# Patient Record
Sex: Female | Born: 1975 | Race: Black or African American | Hispanic: No | Smoking: Current every day smoker
Health system: Southern US, Community
[De-identification: ages and names within clinical notes are randomized; demographics above are authoritative.]

## PROBLEM LIST (undated history)

## (undated) ENCOUNTER — Inpatient Hospital Stay (HOSPITAL_COMMUNITY): Payer: Self-pay

## (undated) DIAGNOSIS — R51 Headache: Secondary | ICD-10-CM

## (undated) DIAGNOSIS — R112 Nausea with vomiting, unspecified: Secondary | ICD-10-CM

## (undated) DIAGNOSIS — Z9889 Other specified postprocedural states: Secondary | ICD-10-CM

## (undated) DIAGNOSIS — M1990 Unspecified osteoarthritis, unspecified site: Secondary | ICD-10-CM

## (undated) DIAGNOSIS — E119 Type 2 diabetes mellitus without complications: Secondary | ICD-10-CM

## (undated) DIAGNOSIS — R569 Unspecified convulsions: Secondary | ICD-10-CM

## (undated) DIAGNOSIS — O209 Hemorrhage in early pregnancy, unspecified: Secondary | ICD-10-CM

## (undated) DIAGNOSIS — Z8759 Personal history of other complications of pregnancy, childbirth and the puerperium: Secondary | ICD-10-CM

## (undated) HISTORY — PX: HAND SURGERY: SHX662

## (undated) HISTORY — DX: Unspecified osteoarthritis, unspecified site: M19.90

## (undated) HISTORY — PX: WISDOM TOOTH EXTRACTION: SHX21

---

## 2008-08-18 ENCOUNTER — Emergency Department (HOSPITAL_COMMUNITY): Admission: EM | Admit: 2008-08-18 | Discharge: 2008-08-18 | Payer: Self-pay | Admitting: *Deleted

## 2008-09-11 ENCOUNTER — Emergency Department (HOSPITAL_COMMUNITY): Admission: EM | Admit: 2008-09-11 | Discharge: 2008-09-11 | Payer: Self-pay | Admitting: Emergency Medicine

## 2009-02-02 ENCOUNTER — Emergency Department: Payer: Self-pay | Admitting: Emergency Medicine

## 2009-02-23 ENCOUNTER — Emergency Department (HOSPITAL_COMMUNITY): Admission: EM | Admit: 2009-02-23 | Discharge: 2009-02-23 | Payer: Self-pay | Admitting: Emergency Medicine

## 2009-03-01 ENCOUNTER — Emergency Department (HOSPITAL_COMMUNITY): Admission: EM | Admit: 2009-03-01 | Discharge: 2009-03-01 | Payer: Self-pay | Admitting: Emergency Medicine

## 2009-07-13 ENCOUNTER — Inpatient Hospital Stay (HOSPITAL_COMMUNITY): Admission: AD | Admit: 2009-07-13 | Discharge: 2009-07-13 | Payer: Self-pay | Admitting: Obstetrics and Gynecology

## 2009-07-13 ENCOUNTER — Emergency Department (HOSPITAL_COMMUNITY): Admission: EM | Admit: 2009-07-13 | Discharge: 2009-07-13 | Payer: Self-pay | Admitting: Family Medicine

## 2009-09-02 ENCOUNTER — Ambulatory Visit: Payer: Self-pay | Admitting: Obstetrics and Gynecology

## 2009-09-02 ENCOUNTER — Inpatient Hospital Stay (HOSPITAL_COMMUNITY): Admission: AD | Admit: 2009-09-02 | Discharge: 2009-09-02 | Payer: Self-pay | Admitting: Obstetrics and Gynecology

## 2009-10-31 ENCOUNTER — Observation Stay (HOSPITAL_COMMUNITY): Admission: AD | Admit: 2009-10-31 | Discharge: 2009-11-01 | Payer: Self-pay | Admitting: Obstetrics and Gynecology

## 2010-01-06 ENCOUNTER — Ambulatory Visit (HOSPITAL_COMMUNITY): Admission: RE | Admit: 2010-01-06 | Discharge: 2010-01-06 | Payer: Self-pay | Admitting: Obstetrics and Gynecology

## 2010-01-31 ENCOUNTER — Ambulatory Visit (HOSPITAL_COMMUNITY): Admission: RE | Admit: 2010-01-31 | Discharge: 2010-01-31 | Payer: Self-pay | Admitting: Obstetrics and Gynecology

## 2010-02-17 ENCOUNTER — Inpatient Hospital Stay (HOSPITAL_COMMUNITY): Admission: AD | Admit: 2010-02-17 | Discharge: 2010-02-17 | Payer: Self-pay | Admitting: Obstetrics and Gynecology

## 2010-02-22 ENCOUNTER — Inpatient Hospital Stay (HOSPITAL_COMMUNITY): Admission: AD | Admit: 2010-02-22 | Discharge: 2010-02-25 | Payer: Self-pay | Admitting: Obstetrics and Gynecology

## 2010-08-13 ENCOUNTER — Emergency Department (HOSPITAL_COMMUNITY): Admission: EM | Admit: 2010-08-13 | Discharge: 2010-08-13 | Payer: Self-pay | Admitting: Emergency Medicine

## 2011-01-11 ENCOUNTER — Encounter: Payer: Self-pay | Admitting: Obstetrics and Gynecology

## 2011-02-11 ENCOUNTER — Ambulatory Visit (INDEPENDENT_AMBULATORY_CARE_PROVIDER_SITE_OTHER): Payer: Medicaid Other

## 2011-02-11 ENCOUNTER — Inpatient Hospital Stay (INDEPENDENT_AMBULATORY_CARE_PROVIDER_SITE_OTHER)
Admission: RE | Admit: 2011-02-11 | Discharge: 2011-02-11 | Disposition: A | Discharge status: Home / Self Care | Payer: Medicaid Other | Source: Ambulatory Visit | Attending: Emergency Medicine | Admitting: Emergency Medicine

## 2011-02-11 ENCOUNTER — Ambulatory Visit (HOSPITAL_COMMUNITY): Payer: Medicaid Other

## 2011-02-11 DIAGNOSIS — M79609 Pain in unspecified limb: Secondary | ICD-10-CM

## 2011-02-16 ENCOUNTER — Other Ambulatory Visit: Payer: Self-pay | Admitting: Orthopedic Surgery

## 2011-02-16 DIAGNOSIS — S92109A Unspecified fracture of unspecified talus, initial encounter for closed fracture: Secondary | ICD-10-CM

## 2011-02-18 ENCOUNTER — Ambulatory Visit
Admission: RE | Admit: 2011-02-18 | Discharge: 2011-02-18 | Disposition: A | Discharge status: Home / Self Care | Payer: Medicaid Other | Source: Ambulatory Visit | Attending: Orthopedic Surgery | Admitting: Orthopedic Surgery

## 2011-02-18 DIAGNOSIS — S92109A Unspecified fracture of unspecified talus, initial encounter for closed fracture: Secondary | ICD-10-CM

## 2011-03-08 IMAGING — CT CT ANKLE*R* W/O CM
4 series · 16 of 33 positions shown, 19 images · non-contrast
Comparison: Radiography same day

CLINICAL DATA: Talus fracture.  Fall from height.

CT OF THE RIGHT ANKLE WITHout CONTRAST
TECHNIQUE: Multidetector CT imaging was performed following the
standard protocol

[Series 3: lower ext bone · axial · 0.31mm/px · z∈[-84,+28]mm · 5 of 69 slices shown, 7 images]
[im 12/69  soft-tissue]
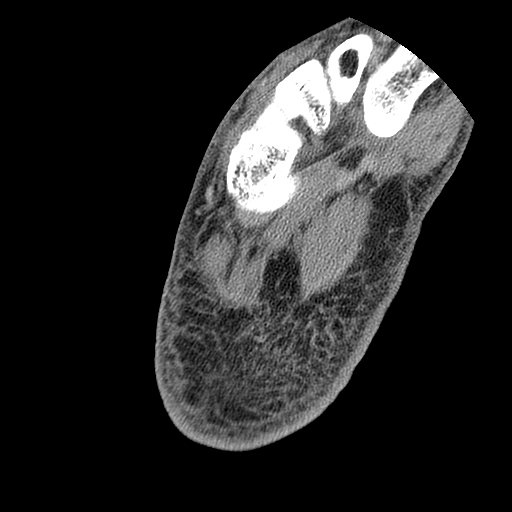
[im 12/69  bone]
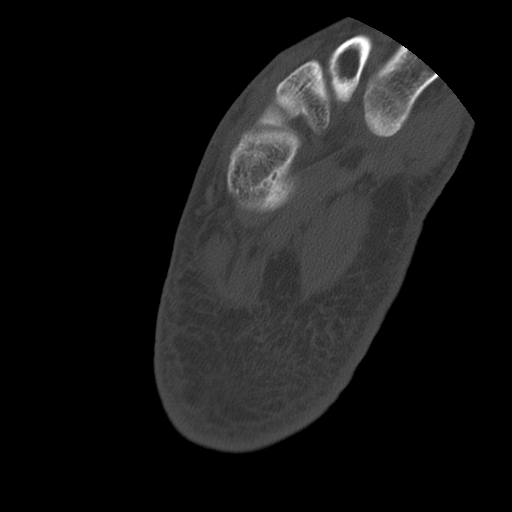
[im 23/69  bone]
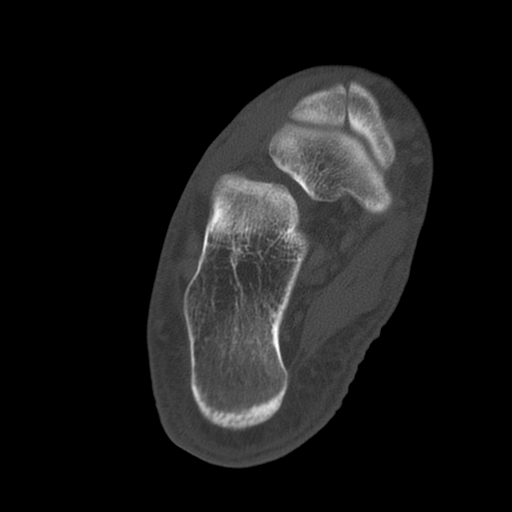
[im 35/69  bone]
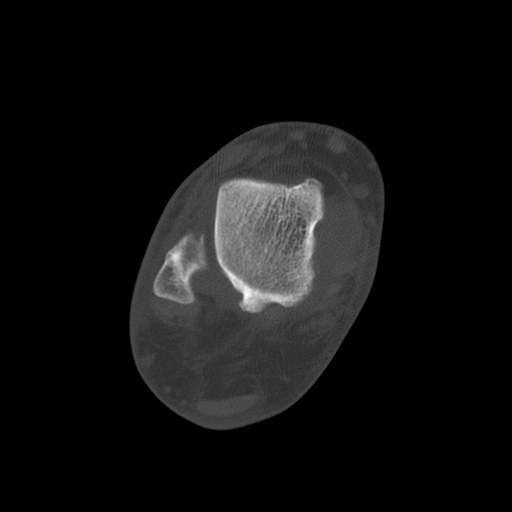
[im 46/69  bone]
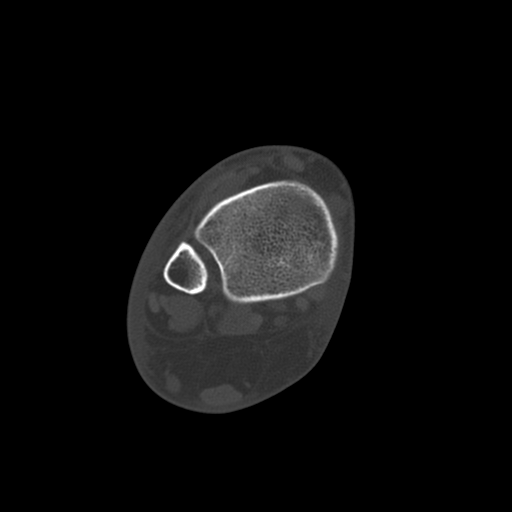
[im 57/69  soft-tissue]
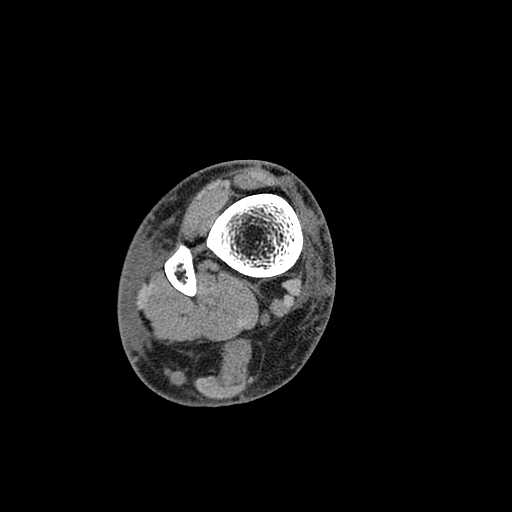
[im 57/69  bone]
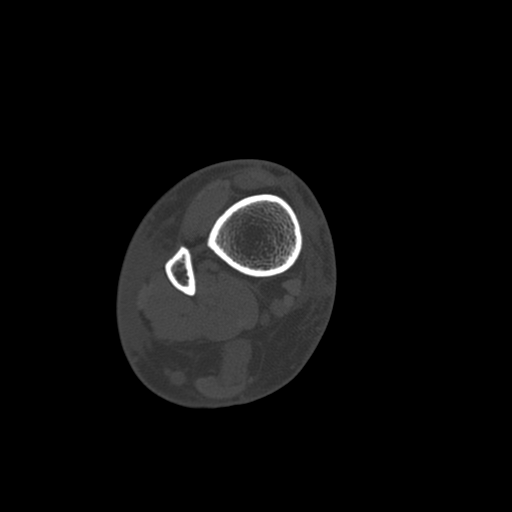

[Series 4: lower ext detail · axial · 0.31mm/px · z∈[-84,-27]mm · 3 of 69 slices shown]
[im 12/69  bone]
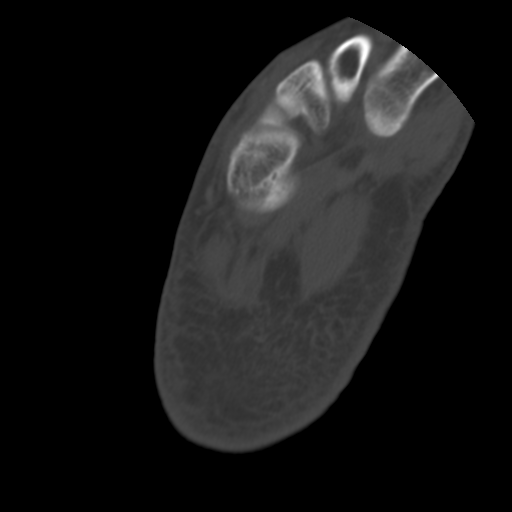
[im 23/69  bone]
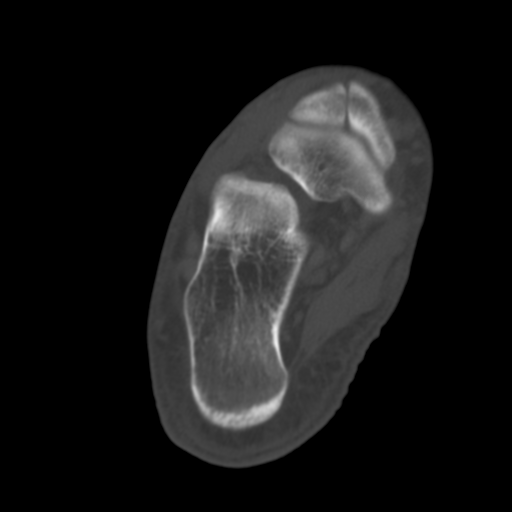
[im 35/69  bone]
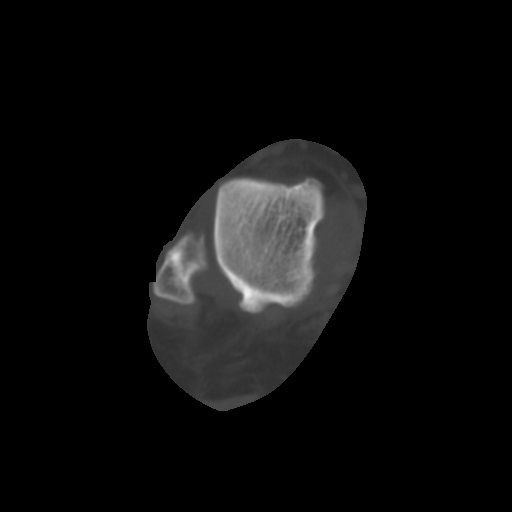

[Series 200: cor · coronal · 0.34mm/px · 3 of 62 slices shown]
[im 13/62  bone]
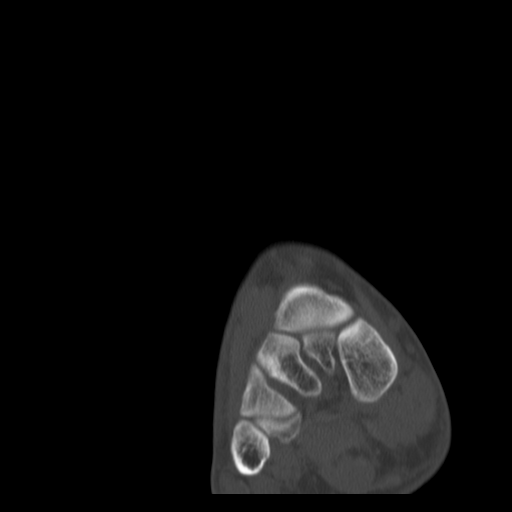
[im 25/62  bone]
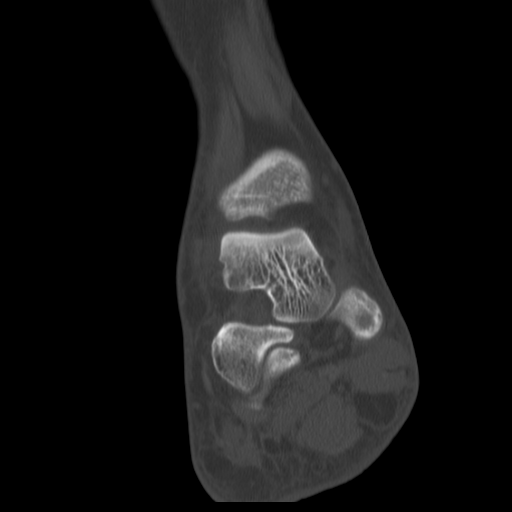
[im 37/62  bone]
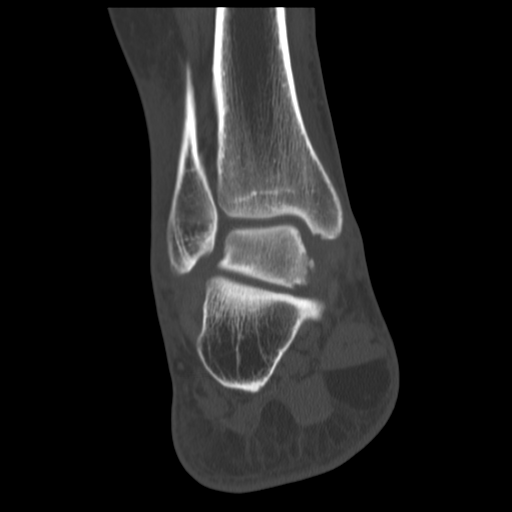

[Series 201: sag · sagittal · 0.34mm/px · 5 of 47 slices shown, 6 images]
[im 16/47  bone]
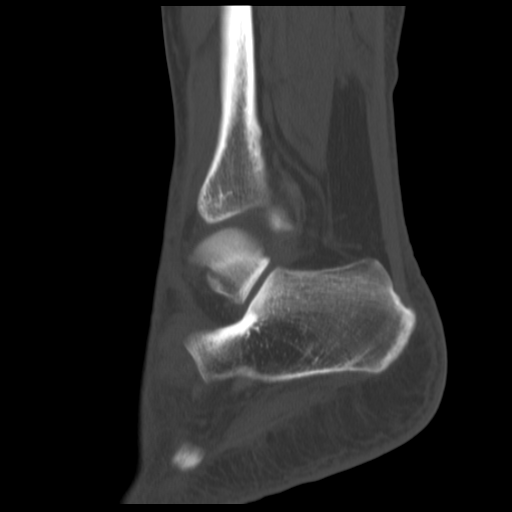
[im 20/47  bone]
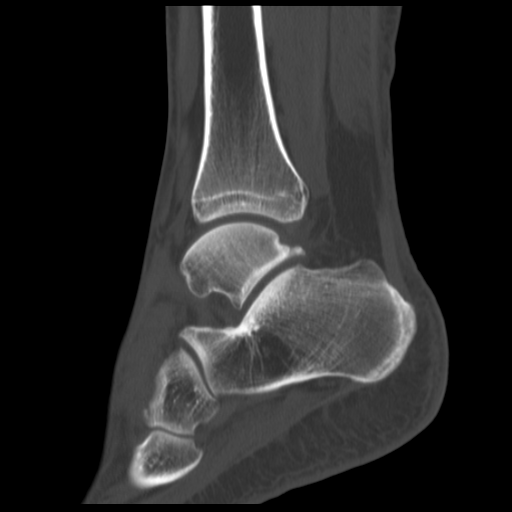
[im 24/47  soft-tissue]
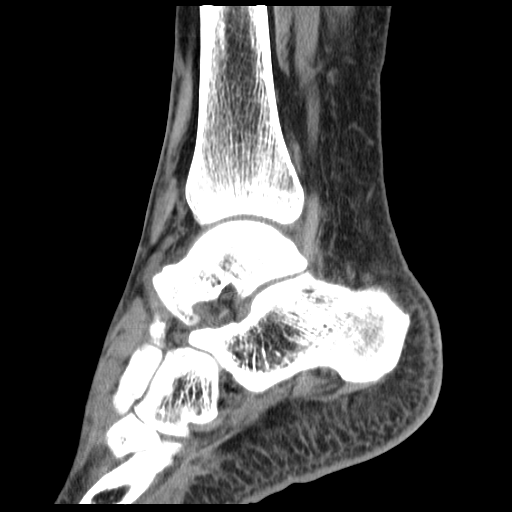
[im 24/47  bone]
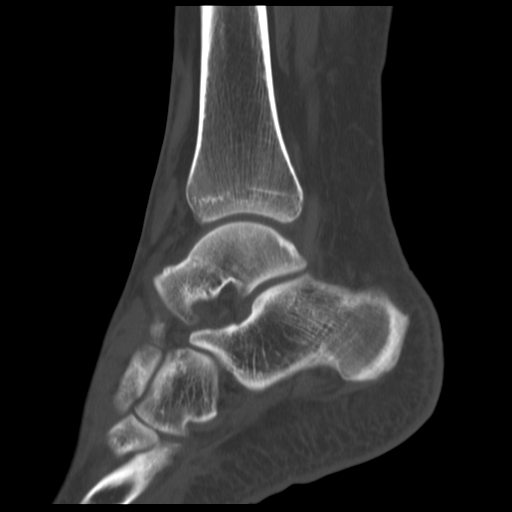
[im 27/47  bone]
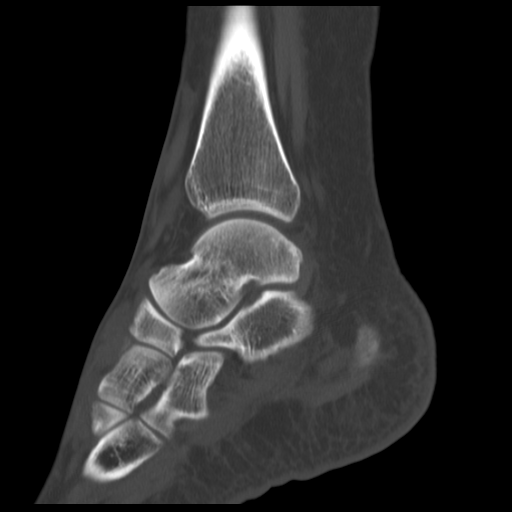
[im 31/47  bone]
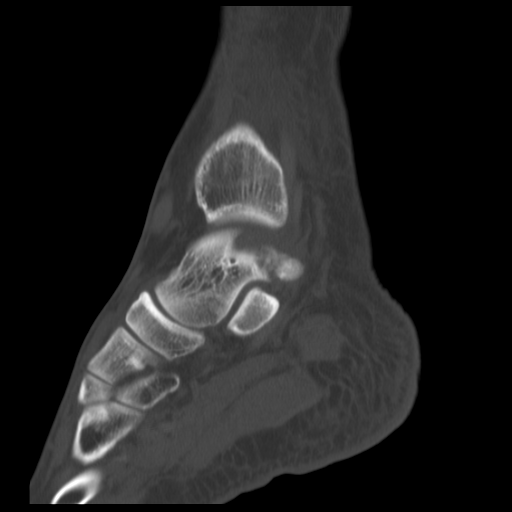

[16 of 33 positions shown; findings below may reference images not displayed]

FINDINGS: There is what appears to be an old nonunited avulsion
fracture of the medial talus at the posterior attachment of the
deltoid ligament.  The margins are not consistent with the
appearance of an acute fracture.  There does not appear to be
particular soft tissue swelling in that region.

The remainder of the exam is normal.  No evidence of compression
fracture of the calcaneus or talus.
IMPRESSION: I think the finding radiography represents an old nonunited
avulsion fracture of the medial talus at the posterior deltoid
ligament attachment.

## 2011-03-16 LAB — CBC
HCT: 42.4 % (ref 36.0–46.0)
Hemoglobin: 11 g/dL — ABNORMAL LOW (ref 12.0–15.0)
MCHC: 33.7 g/dL (ref 30.0–36.0)
MCV: 97.7 fL (ref 78.0–100.0)
Platelets: 115 10*3/uL — ABNORMAL LOW (ref 150–400)
Platelets: 156 10*3/uL (ref 150–400)
RDW: 14 % (ref 11.5–15.5)

## 2011-03-16 LAB — RPR: RPR Ser Ql: NONREACTIVE

## 2011-03-25 LAB — URINE MICROSCOPIC-ADD ON: WBC, UA: NONE SEEN WBC/hpf (ref <3)

## 2011-03-25 LAB — CBC
Hemoglobin: 11.8 g/dL — ABNORMAL LOW (ref 12.0–15.0)
MCHC: 34.1 g/dL (ref 30.0–36.0)
MCV: 95.8 fL (ref 78.0–100.0)
Platelets: 143 10*3/uL — ABNORMAL LOW (ref 150–400)
RBC: 3.88 MIL/uL (ref 3.87–5.11)
RDW: 13.5 % (ref 11.5–15.5)

## 2011-03-25 LAB — WET PREP, GENITAL
Trich, Wet Prep: NONE SEEN
Yeast Wet Prep HPF POC: NONE SEEN

## 2011-03-25 LAB — URINALYSIS, ROUTINE W REFLEX MICROSCOPIC
Bilirubin Urine: NEGATIVE
Leukocytes, UA: NEGATIVE
Nitrite: NEGATIVE
Urobilinogen, UA: 0.2 mg/dL (ref 0.0–1.0)
pH: 5.5 (ref 5.0–8.0)

## 2011-03-25 LAB — GC/CHLAMYDIA PROBE AMP, GENITAL: GC Probe Amp, Genital: NEGATIVE

## 2011-03-29 LAB — WET PREP, GENITAL: Trich, Wet Prep: NONE SEEN

## 2011-03-29 LAB — CBC
HCT: 35.4 % — ABNORMAL LOW (ref 36.0–46.0)
Hemoglobin: 12.3 g/dL (ref 12.0–15.0)
MCHC: 34.8 g/dL (ref 30.0–36.0)
RBC: 3.8 MIL/uL — ABNORMAL LOW (ref 3.87–5.11)
RDW: 14 % (ref 11.5–15.5)

## 2011-03-29 LAB — POCT URINALYSIS DIP (DEVICE)
Bilirubin Urine: NEGATIVE
Hgb urine dipstick: NEGATIVE
Ketones, ur: NEGATIVE mg/dL
pH: 7 (ref 5.0–8.0)

## 2011-03-29 LAB — POCT I-STAT, CHEM 8
Chloride: 102 mEq/L (ref 96–112)
Creatinine, Ser: 0.7 mg/dL (ref 0.4–1.2)
Glucose, Bld: 78 mg/dL (ref 70–99)
HCT: 40 % (ref 36.0–46.0)
Potassium: 3.8 mEq/L (ref 3.5–5.1)

## 2011-03-29 LAB — GC/CHLAMYDIA PROBE AMP, GENITAL: Chlamydia, DNA Probe: NEGATIVE

## 2011-07-18 ENCOUNTER — Emergency Department (HOSPITAL_COMMUNITY)
Admission: EM | Admit: 2011-07-18 | Discharge: 2011-07-19 | Disposition: A | Discharge status: Home / Self Care | Payer: Medicaid Other | Attending: Emergency Medicine | Admitting: Emergency Medicine

## 2011-07-18 DIAGNOSIS — W010XXA Fall on same level from slipping, tripping and stumbling without subsequent striking against object, initial encounter: Secondary | ICD-10-CM | POA: Insufficient documentation

## 2011-07-18 DIAGNOSIS — M25579 Pain in unspecified ankle and joints of unspecified foot: Secondary | ICD-10-CM | POA: Insufficient documentation

## 2011-07-18 DIAGNOSIS — S93409A Sprain of unspecified ligament of unspecified ankle, initial encounter: Secondary | ICD-10-CM | POA: Insufficient documentation

## 2011-07-19 ENCOUNTER — Emergency Department (HOSPITAL_COMMUNITY): Payer: Medicaid Other

## 2011-09-21 LAB — URINALYSIS, ROUTINE W REFLEX MICROSCOPIC
Bilirubin Urine: NEGATIVE
Glucose, UA: NEGATIVE
Hgb urine dipstick: NEGATIVE
Protein, ur: NEGATIVE
Urobilinogen, UA: 0.2

## 2011-09-21 LAB — COMPREHENSIVE METABOLIC PANEL
Albumin: 3.9
Alkaline Phosphatase: 48
BUN: 12
CO2: 27
Chloride: 103
Creatinine, Ser: 0.89
GFR calc non Af Amer: >60
Glucose, Bld: 94
Potassium: 4
Total Bilirubin: 0.6

## 2011-09-21 LAB — GC/CHLAMYDIA PROBE AMP, GENITAL
Chlamydia, DNA Probe: NEGATIVE
GC Probe Amp, Genital: NEGATIVE

## 2011-09-21 LAB — CBC
HCT: 37.1
Hemoglobin: 12.5
MCV: 92.9
Platelets: 166
WBC: 8.4

## 2011-09-21 LAB — DIFFERENTIAL
Basophils Absolute: 0.1
Basophils Relative: 2 — ABNORMAL HIGH
Lymphocytes Relative: 39
Neutro Abs: 4.1
Neutrophils Relative %: 48

## 2011-09-21 LAB — RPR: RPR Ser Ql: NONREACTIVE

## 2011-09-21 LAB — WET PREP, GENITAL: Yeast Wet Prep HPF POC: NONE SEEN

## 2011-09-21 LAB — LIPASE, BLOOD: Lipase: 19

## 2011-10-12 ENCOUNTER — Emergency Department (HOSPITAL_COMMUNITY)
Admission: EM | Admit: 2011-10-12 | Discharge: 2011-10-12 | Disposition: A | Discharge status: Home / Self Care | Payer: Medicaid Other | Attending: Emergency Medicine | Admitting: Emergency Medicine

## 2011-10-13 ENCOUNTER — Inpatient Hospital Stay (INDEPENDENT_AMBULATORY_CARE_PROVIDER_SITE_OTHER)
Admission: RE | Admit: 2011-10-13 | Discharge: 2011-10-13 | Disposition: A | Discharge status: Home / Self Care | Payer: Medicaid Other | Source: Ambulatory Visit | Attending: Family Medicine | Admitting: Family Medicine

## 2011-10-13 DIAGNOSIS — H7290 Unspecified perforation of tympanic membrane, unspecified ear: Secondary | ICD-10-CM

## 2012-06-08 ENCOUNTER — Emergency Department (HOSPITAL_COMMUNITY)
Admission: EM | Admit: 2012-06-08 | Discharge: 2012-06-08 | Disposition: A | Discharge status: Home / Self Care | Payer: Medicaid Other | Source: Home / Self Care | Attending: Family Medicine | Admitting: Family Medicine

## 2012-06-08 ENCOUNTER — Encounter (HOSPITAL_COMMUNITY): Payer: Self-pay | Admitting: *Deleted

## 2012-06-08 DIAGNOSIS — Z331 Pregnant state, incidental: Secondary | ICD-10-CM

## 2012-06-08 DIAGNOSIS — J069 Acute upper respiratory infection, unspecified: Secondary | ICD-10-CM

## 2012-06-08 LAB — POCT PREGNANCY, URINE: Preg Test, Ur: POSITIVE — AB

## 2012-06-08 MED ORDER — DEXTROMETHORPHAN POLISTIREX 30 MG/5ML PO LQCR: 60.0000 mg | ORAL | Status: AC | PRN

## 2012-06-08 MED ORDER — AZITHROMYCIN 250 MG PO TABS: ORAL_TABLET | ORAL | Status: AC

## 2012-06-08 MED ORDER — FLUTICASONE PROPIONATE 50 MCG/ACT NA SUSP: 1.0000 | Freq: Two times a day (BID) | NASAL | Status: DC

## 2012-06-08 NOTE — ED Provider Notes (Signed)
History     CSN: [id number]  Arrival date & time 06/08/12  1258   First MD Initiated Contact with Patient 06/08/12 1449      Chief Complaint  Patient presents with  . Cough  . Nasal Congestion  . Fever  . Emesis  . Nausea    (Consider location/radiation/quality/duration/timing/severity/associated sxs/prior treatment) Patient is a 36 y.o. female presenting with cough, fever, and vomiting. The history is provided by the patient.  Cough This is a new problem. The current episode started more than 1 week ago (2 wks of sx). The problem has not changed since onset.The cough is productive of sputum. There has been no fever. Associated symptoms include rhinorrhea. Associated symptoms comments: lmp 5/6 not on bc, g6p3. She is not a smoker.  Fever Primary symptoms of the febrile illness include fever, cough and vomiting.  Emesis  Associated symptoms include cough and a fever.    History reviewed. No pertinent past medical history.  Past Surgical History  Procedure Date  . Cesarean section     History reviewed. No pertinent family history.  History  Substance Use Topics  . Smoking status: Current Some Day Smoker  . Smokeless tobacco: Not on file  . Alcohol Use: No    OB History    Grav Para Term Preterm Abortions TAB SAB Ect Mult Living                  Review of Systems  Constitutional: Positive for fever.  HENT: Positive for congestion, rhinorrhea and postnasal drip.   Respiratory: Positive for cough.   Cardiovascular: Negative.   Gastrointestinal: Positive for vomiting.  Genitourinary: Negative for vaginal bleeding and vaginal pain.    Allergies  Review of patient's allergies indicates no known allergies.  Home Medications   Current Outpatient Rx  Name Route Sig Dispense Refill  . ACETAMINOPHEN 325 MG PO TABS Oral Take 650 mg by mouth every 6 (six) hours as needed.    . GUAIFENESIN ER 600 MG PO TB12 Oral Take 1,200 mg by mouth 2 (two) times daily.    .  AZITHROMYCIN 250 MG PO TABS  Take as directed on pack 6 each 0  . DEXTROMETHORPHAN POLISTIREX ER 30 MG/5ML PO LQCR Oral Take 10 mLs (60 mg total) by mouth as needed for cough. 89 mL 0  . FLUTICASONE PROPIONATE 50 MCG/ACT NA SUSP Nasal Place 1 spray into the nose 2 (two) times daily. 1 g 2    BP 117/74  Pulse 66  Temp 98.3 F (36.8 C) (Oral)  Resp 18  SpO2 100%  LMP 04/25/2012  Physical Exam  Nursing note and vitals reviewed. Constitutional: She is oriented to person, place, and time. She appears well-developed and well-nourished.  HENT:  Head: Normocephalic.  Right Ear: External ear normal.  Left Ear: External ear normal.  Nose: Rhinorrhea present.  Mouth/Throat: Oropharynx is clear and moist.  Eyes: Pupils are equal, round, and reactive to light.  Neck: Normal range of motion. Neck supple.  Cardiovascular: Normal rate, regular rhythm, normal heart sounds and intact distal pulses.   Pulmonary/Chest: Effort normal and breath sounds normal. She has no wheezes. She has no rales.  Lymphadenopathy:    She has no cervical adenopathy.  Neurological: She is alert and oriented to person, place, and time.  Skin: Skin is warm and dry.    ED Course  Procedures (including critical care time)  Labs Reviewed  POCT PREGNANCY, URINE - Abnormal; Notable for the following:  Preg Test, Ur POSITIVE (*)     All other components within normal limits   No results found.   1. URI, acute   2. Pregnancy as incidental finding       MDM          Linna Hoff, MD 06/08/12 1523

## 2012-06-08 NOTE — ED Notes (Addendum)
Pt with c/o cough and congestion x 2 weeks lost sense of smell over the week end - onset of vomiting Sunday -  now with blood in sputum - nose bleed this am - today vomited x one -

## 2012-08-07 ENCOUNTER — Inpatient Hospital Stay (HOSPITAL_COMMUNITY): Payer: Medicaid Other

## 2012-08-07 ENCOUNTER — Inpatient Hospital Stay (HOSPITAL_COMMUNITY)
Admission: AD | Admit: 2012-08-07 | Discharge: 2012-08-07 | Disposition: A | Discharge status: Home / Self Care | Payer: Medicaid Other | Source: Ambulatory Visit | Attending: Obstetrics and Gynecology | Admitting: Obstetrics and Gynecology

## 2012-08-07 ENCOUNTER — Encounter (HOSPITAL_COMMUNITY): Payer: Self-pay | Admitting: Obstetrics and Gynecology

## 2012-08-07 DIAGNOSIS — R109 Unspecified abdominal pain: Secondary | ICD-10-CM | POA: Insufficient documentation

## 2012-08-07 DIAGNOSIS — O209 Hemorrhage in early pregnancy, unspecified: Secondary | ICD-10-CM | POA: Insufficient documentation

## 2012-08-07 DIAGNOSIS — O4690 Antepartum hemorrhage, unspecified, unspecified trimester: Secondary | ICD-10-CM

## 2012-08-07 HISTORY — DX: Hemorrhage in early pregnancy, unspecified: O20.9

## 2012-08-07 HISTORY — DX: Personal history of other complications of pregnancy, childbirth and the puerperium: Z87.59

## 2012-08-07 NOTE — MAU Provider Note (Signed)
History     CSN: [id number]  Arrival date & time 08/07/12  1226   None     No chief complaint on file.   (Consider location/radiation/quality/duration/timing/severity/associated sxs/prior treatment) HPI Janice Boyd is a 36 y.o.  G1P0 at [redacted]w[redacted]d. She presents with sudden vaginal bleeding this am while on the toilet, has saturated her clothing, sm clots. Has low cramping off/on. + FHT's.  No past medical history on file.  Past Surgical History  Procedure Date  . Cesarean section     No family history on file.  History  Substance Use Topics  . Smoking status: Current Some Day Smoker  . Smokeless tobacco: Not on file  . Alcohol Use: No    OB History    Grav Para Term Preterm Abortions TAB SAB Ect Mult Living   1               Review of Systems  Constitutional: Negative for fever and chills.  Genitourinary: Positive for vaginal bleeding. Negative for dysuria, urgency, frequency, vaginal discharge and vaginal pain.    Allergies  Review of patient's allergies indicates no known allergies.  Home Medications  No current outpatient prescriptions on file.  BP 119/68  Pulse 82  Temp 98.6 F (37 C) (Oral)  Resp 18  LMP 04/25/2012  Physical Exam  Constitutional: She is oriented to person, place, and time. She appears well-developed and well-nourished. She appears distressed.  Genitourinary:       Ext gen- nl anatomy, skin intact, sm amt dark blood Vagina- small amt dark blood, one 1-2 cm clot Cx closed, thick Uterus- gravid 14-16 wk size  Adn- non tender  Musculoskeletal: Normal range of motion.  Neurological: She is alert and oriented to person, place, and time.  Skin: Skin is warm and dry.  Psychiatric: She has a normal mood and affect.    ED Course  Procedures (including critical care time)  Labs Reviewed - No data to display No results found.   No diagnosis found.    MDM  U/S- no abruption or previa noted, 14 6/7 wks by dating and previous  U/S's.  Blood type A+  Consulted with Dr Ambrose Mantle- home on pelvic rest F/u in office next week as scheduled Call with other changes or symptoms.

## 2012-08-07 NOTE — MAU Note (Signed)
Pt presents to MAU with chief complaint of vaginal bleeding. Pt is [redacted]w[redacted]d: says she has been cramping and had a big gush of blood, soaked a pad.

## 2012-09-19 ENCOUNTER — Emergency Department (HOSPITAL_COMMUNITY)
Admission: EM | Admit: 2012-09-19 | Discharge: 2012-09-19 | Disposition: A | Discharge status: Home / Self Care | Payer: Medicaid Other | Attending: Emergency Medicine | Admitting: Emergency Medicine

## 2012-09-19 ENCOUNTER — Encounter (HOSPITAL_COMMUNITY): Payer: Self-pay | Admitting: Emergency Medicine

## 2012-09-19 ENCOUNTER — Emergency Department (HOSPITAL_COMMUNITY): Payer: Medicaid Other

## 2012-09-19 DIAGNOSIS — X500XXA Overexertion from strenuous movement or load, initial encounter: Secondary | ICD-10-CM | POA: Insufficient documentation

## 2012-09-19 DIAGNOSIS — O99891 Other specified diseases and conditions complicating pregnancy: Secondary | ICD-10-CM | POA: Insufficient documentation

## 2012-09-19 DIAGNOSIS — O99330 Smoking (tobacco) complicating pregnancy, unspecified trimester: Secondary | ICD-10-CM | POA: Insufficient documentation

## 2012-09-19 DIAGNOSIS — Y9389 Activity, other specified: Secondary | ICD-10-CM | POA: Insufficient documentation

## 2012-09-19 DIAGNOSIS — S93409A Sprain of unspecified ligament of unspecified ankle, initial encounter: Secondary | ICD-10-CM | POA: Insufficient documentation

## 2012-09-19 DIAGNOSIS — Y998 Other external cause status: Secondary | ICD-10-CM | POA: Insufficient documentation

## 2012-09-19 NOTE — ED Notes (Signed)
Pt felt a "pop" in left ankle while at school today. Pt now with severe pain and mild swelling.

## 2012-09-19 NOTE — ED Provider Notes (Signed)
Medical screening examination/treatment/procedure(s) were performed by non-physician practitioner and as supervising physician I was immediately available for consultation/collaboration.  Hellena Pridgen, MD 09/19/12 1701 

## 2012-09-19 NOTE — ED Provider Notes (Signed)
History     CSN: [id number]  Arrival date & time 09/19/12  1215   First MD Initiated Contact with Patient 09/19/12 1225      Chief Complaint  Patient presents with  . Ankle Pain    (Consider location/radiation/quality/duration/timing/severity/associated sxs/prior treatment) HPI Comments: This is a 36 year old pregnant female who presents to the emergency department with a chief complaint of left ankle pain. All the patient states that she was scoring, and while moving to a new desk rolled her ankle. She describes hearing a pop, followed by immediate swelling. She states that she is an 8/10 pain. The pain does not radiate.  She has not tried anything for the pain.  The history is provided by the patient. No language interpreter was used.    Past Medical History  Diagnosis Date  . Abortion history   . Vaginal bleeding before [redacted] weeks gestation   . Depression     Past Surgical History  Procedure Date  . Cesarean section     History reviewed. No pertinent family history.  History  Substance Use Topics  . Smoking status: Current Some Day Smoker  . Smokeless tobacco: Not on file  . Alcohol Use: No    OB History    Grav Para Term Preterm Abortions TAB SAB Ect Mult Living   7 3 3  0 3 3 0 0 0 3      Review of Systems  Constitutional: Negative for fever.  Respiratory: Negative for shortness of breath.   Cardiovascular: Positive for chest pain.  Gastrointestinal: Negative for abdominal pain.  Genitourinary: Negative for dysuria.  Musculoskeletal:       Left ankle pain  Skin: Negative for rash.  Neurological: Negative for weakness.  Psychiatric/Behavioral: Negative for agitation.  All other systems reviewed and are negative.    Allergies  Latex  Home Medications   Current Outpatient Rx  Name Route Sig Dispense Refill  . ACETAMINOPHEN 500 MG PO TABS Oral Take 1,000 mg by mouth every 6 (six) hours as needed. For pain    . PRENATAL MULTIVITAMIN CH Oral Take 1  tablet by mouth daily.      BP 107/60  Pulse 73  SpO2 100%  LMP 04/25/2012  Physical Exam  Nursing note and vitals reviewed. Constitutional: She is oriented to person, place, and time. She appears well-developed and well-nourished.  HENT:  Head: Normocephalic and atraumatic.  Eyes: Conjunctivae normal and EOM are normal. Pupils are equal, round, and reactive to light.  Neck: Normal range of motion. Neck supple.  Cardiovascular: Normal rate, regular rhythm and normal heart sounds.   Pulmonary/Chest: Effort normal and breath sounds normal.  Abdominal: Soft. Bowel sounds are normal.  Musculoskeletal:       Left posterior malleolus tender to palpation, tender to palpation over ATFL, anterior drawer test deferred 2/2 pain.  ROM 4/5 limited by pain, strength deferred 2/2 pain. Intact sensation and capillary refill.   Neurological: She is alert and oriented to person, place, and time.  Skin: Skin is warm and dry.  Psychiatric: She has a normal mood and affect. Her behavior is normal. Judgment and thought content normal.    ED Course  Procedures (including critical care time)  Results for orders placed during the hospital encounter of 06/08/12  POCT PREGNANCY, URINE      Component Value Range   Preg Test, Ur POSITIVE (*) NEGATIVE   Dg Ankle Complete Left  09/19/2012  *RADIOLOGY REPORT*  Clinical Data: Ankle injury.  Ankle pain.  Pain and swelling over lateral malleolus.  LEFT ANKLE COMPLETE - 3+ VIEW  Comparison: None.  Findings: Soft tissue swelling is present over lateral malleolus. There is no underlying fracture.  No significant joint effusion is present.  The joint is located.  IMPRESSION:  1.  Soft tissue swelling over lateral malleolus without an underlying fracture. A soft tissue injury is not excluded. 2.  No acute osseous abnormality.   Original Report Authenticated By: Jamesetta Orleans. MATTERN, M.D.       1. Ankle sprain       MDM  This is a 36 year old pregnant female  with a left-sided ankle sprain.  [place] rules indicate xray based on posterior lateral malleolus tenderness to palpation.  Xrays reveal no acute fracture, but cannot rule out soft tissue injury.  Will discharge to home with ACE wrap and instructions to follow-up with her orthopedist in 5 days if no improvement.  Patient educated on RICE therapy, and counseled to avoid pain medicine 2/2 pregnancy.  She agrees with this plan.  The patient is stable and ready for discharge.        Roxy Horseman, PA-C 09/19/12 1321

## 2012-12-19 ENCOUNTER — Encounter (HOSPITAL_COMMUNITY)
Admission: AD | Disposition: A | Discharge status: Home / Self Care | Payer: Self-pay | Source: Ambulatory Visit | Attending: Obstetrics and Gynecology

## 2012-12-19 ENCOUNTER — Encounter (HOSPITAL_COMMUNITY): Payer: Self-pay | Admitting: Anesthesiology

## 2012-12-19 ENCOUNTER — Inpatient Hospital Stay (HOSPITAL_COMMUNITY): Payer: Medicaid Other | Admitting: Anesthesiology

## 2012-12-19 ENCOUNTER — Inpatient Hospital Stay (HOSPITAL_COMMUNITY)
Admission: AD | Admit: 2012-12-19 | Discharge: 2012-12-22 | DRG: 765 | Disposition: A | Discharge status: Home / Self Care | Payer: Medicaid Other | Source: Ambulatory Visit | Attending: Obstetrics and Gynecology | Admitting: Obstetrics and Gynecology

## 2012-12-19 ENCOUNTER — Encounter (HOSPITAL_COMMUNITY): Payer: Self-pay | Admitting: *Deleted

## 2012-12-19 DIAGNOSIS — O368390 Maternal care for abnormalities of the fetal heart rate or rhythm, unspecified trimester, not applicable or unspecified: Secondary | ICD-10-CM

## 2012-12-19 DIAGNOSIS — O4590 Premature separation of placenta, unspecified, unspecified trimester: Principal | ICD-10-CM | POA: Diagnosis present

## 2012-12-19 DIAGNOSIS — O212 Late vomiting of pregnancy: Secondary | ICD-10-CM | POA: Diagnosis present

## 2012-12-19 DIAGNOSIS — J069 Acute upper respiratory infection, unspecified: Secondary | ICD-10-CM | POA: Diagnosis present

## 2012-12-19 DIAGNOSIS — O99892 Other specified diseases and conditions complicating childbirth: Secondary | ICD-10-CM | POA: Diagnosis present

## 2012-12-19 DIAGNOSIS — O34219 Maternal care for unspecified type scar from previous cesarean delivery: Secondary | ICD-10-CM | POA: Diagnosis present

## 2012-12-19 LAB — APTT: aPTT: 29 seconds (ref 24–37)

## 2012-12-19 LAB — CBC
MCHC: 34.6 g/dL (ref 30.0–36.0)
Platelets: 109 10*3/uL — ABNORMAL LOW (ref 150–400)
RDW: 13.2 % (ref 11.5–15.5)

## 2012-12-19 LAB — PROTIME-INR: Prothrombin Time: 13.6 seconds (ref 11.6–15.2)

## 2012-12-19 SURGERY — Surgical Case
Anesthesia: General | Site: Abdomen | Wound class: Clean Contaminated

## 2012-12-19 MED ORDER — FENTANYL CITRATE 0.05 MG/ML IJ SOLN
INTRAMUSCULAR | Status: DC | PRN
  Administered 2012-12-19: 250 ug via INTRAVENOUS
  Administered 2012-12-19 (×5): 50 ug via INTRAVENOUS

## 2012-12-19 MED ORDER — OXYCODONE-ACETAMINOPHEN 5-325 MG PO TABS
1.0000 | ORAL_TABLET | ORAL | Status: DC | PRN
  Administered 2012-12-20: 2 via ORAL
  Administered 2012-12-20 (×3): 1 via ORAL
  Administered 2012-12-21 (×2): 2 via ORAL
  Administered 2012-12-21 – 2012-12-22 (×2): 1 via ORAL
  Filled 2012-12-19 (×6): qty 1
  Filled 2012-12-19: qty 2
  Filled 2012-12-19: qty 1
  Filled 2012-12-19: qty 2

## 2012-12-19 MED ORDER — METOCLOPRAMIDE HCL 5 MG/ML IJ SOLN: 10.0000 mg | Freq: Once | INTRAMUSCULAR | Status: DC | PRN

## 2012-12-19 MED ORDER — SODIUM CHLORIDE 0.9 % IJ SOLN: 9.0000 mL | INTRAMUSCULAR | Status: DC | PRN

## 2012-12-19 MED ORDER — LACTATED RINGERS IV SOLN
40.0000 [IU] | INTRAVENOUS | Status: DC | PRN
  Administered 2012-12-19: 40 [IU] via INTRAVENOUS

## 2012-12-19 MED ORDER — MAGNESIUM HYDROXIDE 400 MG/5ML PO SUSP: 30.0000 mL | ORAL | Status: DC | PRN

## 2012-12-19 MED ORDER — CEFAZOLIN SODIUM-DEXTROSE 2-3 GM-% IV SOLR
INTRAVENOUS | Status: AC
  Filled 2012-12-19: qty 50

## 2012-12-19 MED ORDER — IBUPROFEN 600 MG PO TABS
600.0000 mg | ORAL_TABLET | Freq: Four times a day (QID) | ORAL | Status: DC
  Administered 2012-12-19 – 2012-12-22 (×12): 600 mg via ORAL
  Filled 2012-12-19 (×12): qty 1

## 2012-12-19 MED ORDER — SUCCINYLCHOLINE CHLORIDE 20 MG/ML IJ SOLN
INTRAMUSCULAR | Status: AC
  Filled 2012-12-19: qty 10

## 2012-12-19 MED ORDER — PNEUMOCOCCAL VAC POLYVALENT 25 MCG/0.5ML IJ INJ
0.5000 mL | INJECTION | INTRAMUSCULAR | Status: AC
  Administered 2012-12-20: 0.5 mL via INTRAMUSCULAR
  Filled 2012-12-19: qty 0.5

## 2012-12-19 MED ORDER — LIDOCAINE HCL (CARDIAC) 20 MG/ML IV SOLN
INTRAVENOUS | Status: DC | PRN
  Administered 2012-12-19: 60 mg via INTRAVENOUS

## 2012-12-19 MED ORDER — ONDANSETRON HCL 4 MG/2ML IJ SOLN
INTRAMUSCULAR | Status: AC
  Filled 2012-12-19: qty 2

## 2012-12-19 MED ORDER — ZOLPIDEM TARTRATE 5 MG PO TABS
5.0000 mg | ORAL_TABLET | Freq: Every evening | ORAL | Status: DC | PRN
  Administered 2012-12-20: 5 mg via ORAL
  Filled 2012-12-19: qty 1

## 2012-12-19 MED ORDER — ONDANSETRON HCL 4 MG/2ML IJ SOLN
INTRAMUSCULAR | Status: DC | PRN
  Administered 2012-12-19: 4 mg via INTRAVENOUS

## 2012-12-19 MED ORDER — MIDAZOLAM HCL 5 MG/5ML IJ SOLN
INTRAMUSCULAR | Status: DC | PRN
  Administered 2012-12-19: 2 mg via INTRAVENOUS

## 2012-12-19 MED ORDER — MENTHOL 3 MG MT LOZG
1.0000 | LOZENGE | OROMUCOSAL | Status: DC | PRN
  Administered 2012-12-20: 3 mg via ORAL
  Filled 2012-12-19: qty 9

## 2012-12-19 MED ORDER — PROPOFOL 10 MG/ML IV EMUL
INTRAVENOUS | Status: DC | PRN
  Administered 2012-12-19: 200 mg via INTRAVENOUS

## 2012-12-19 MED ORDER — WITCH HAZEL-GLYCERIN EX PADS: 1.0000 "application " | MEDICATED_PAD | CUTANEOUS | Status: DC | PRN

## 2012-12-19 MED ORDER — LIDOCAINE HCL (CARDIAC) 20 MG/ML IV SOLN
INTRAVENOUS | Status: AC
  Filled 2012-12-19: qty 5

## 2012-12-19 MED ORDER — HYDROMORPHONE HCL PF 1 MG/ML IJ SOLN
0.2500 mg | INTRAMUSCULAR | Status: DC | PRN
  Administered 2012-12-19 (×3): 0.5 mg via INTRAVENOUS

## 2012-12-19 MED ORDER — LANOLIN HYDROUS EX OINT: 1.0000 "application " | TOPICAL_OINTMENT | CUTANEOUS | Status: DC | PRN

## 2012-12-19 MED ORDER — HYDROMORPHONE HCL PF 1 MG/ML IJ SOLN
INTRAMUSCULAR | Status: DC | PRN
  Administered 2012-12-19 (×2): 1 mg via INTRAVENOUS

## 2012-12-19 MED ORDER — SUCCINYLCHOLINE CHLORIDE 20 MG/ML IJ SOLN
INTRAMUSCULAR | Status: DC | PRN
  Administered 2012-12-19: 100 mg via INTRAVENOUS

## 2012-12-19 MED ORDER — HYDROMORPHONE HCL PF 1 MG/ML IJ SOLN
INTRAMUSCULAR | Status: AC
  Administered 2012-12-19: 0.5 mg via INTRAVENOUS
  Filled 2012-12-19: qty 1

## 2012-12-19 MED ORDER — PRENATAL MULTIVITAMIN CH
1.0000 | ORAL_TABLET | Freq: Every day | ORAL | Status: DC
  Administered 2012-12-20 – 2012-12-22 (×3): 1 via ORAL
  Filled 2012-12-19 (×3): qty 1

## 2012-12-19 MED ORDER — SIMETHICONE 80 MG PO CHEW
80.0000 mg | CHEWABLE_TABLET | Freq: Three times a day (TID) | ORAL | Status: DC
  Administered 2012-12-19 – 2012-12-22 (×9): 80 mg via ORAL

## 2012-12-19 MED ORDER — DIBUCAINE 1 % RE OINT: 1.0000 "application " | TOPICAL_OINTMENT | RECTAL | Status: DC | PRN

## 2012-12-19 MED ORDER — HYDROMORPHONE HCL PF 1 MG/ML IJ SOLN
INTRAMUSCULAR | Status: AC
  Filled 2012-12-19: qty 1

## 2012-12-19 MED ORDER — ONDANSETRON HCL 4 MG/2ML IJ SOLN: 4.0000 mg | INTRAMUSCULAR | Status: DC | PRN

## 2012-12-19 MED ORDER — OXYTOCIN 10 UNIT/ML IJ SOLN
INTRAMUSCULAR | Status: AC
  Filled 2012-12-19: qty 4

## 2012-12-19 MED ORDER — HYDROMORPHONE 0.3 MG/ML IV SOLN
INTRAVENOUS | Status: DC
  Administered 2012-12-19: 1.39 mg via INTRAVENOUS
  Administered 2012-12-19: 11:00:00 via INTRAVENOUS
  Administered 2012-12-19: 0.999 mg via INTRAVENOUS
  Administered 2012-12-20 (×2): 2 mL via INTRAVENOUS
  Filled 2012-12-19: qty 25

## 2012-12-19 MED ORDER — NALOXONE HCL 0.4 MG/ML IJ SOLN: 0.4000 mg | INTRAMUSCULAR | Status: DC | PRN

## 2012-12-19 MED ORDER — FENTANYL CITRATE 0.05 MG/ML IJ SOLN
INTRAMUSCULAR | Status: AC
  Filled 2012-12-19: qty 5

## 2012-12-19 MED ORDER — CEFAZOLIN SODIUM-DEXTROSE 2-3 GM-% IV SOLR
INTRAVENOUS | Status: DC | PRN
  Administered 2012-12-19: 2 g via INTRAVENOUS

## 2012-12-19 MED ORDER — ONDANSETRON HCL 4 MG PO TABS: 4.0000 mg | ORAL_TABLET | ORAL | Status: DC | PRN

## 2012-12-19 MED ORDER — SIMETHICONE 80 MG PO CHEW: 80.0000 mg | CHEWABLE_TABLET | ORAL | Status: DC | PRN

## 2012-12-19 MED ORDER — LACTATED RINGERS IV SOLN
INTRAVENOUS | Status: DC | PRN
  Administered 2012-12-19 (×4): via INTRAVENOUS

## 2012-12-19 MED ORDER — MEASLES, MUMPS & RUBELLA VAC ~~LOC~~ INJ: 0.5000 mL | INJECTION | Freq: Once | SUBCUTANEOUS | Status: DC

## 2012-12-19 MED ORDER — MEPERIDINE HCL 25 MG/ML IJ SOLN: 6.2500 mg | INTRAMUSCULAR | Status: DC | PRN

## 2012-12-19 MED ORDER — LACTATED RINGERS IV SOLN
INTRAVENOUS | Status: DC
  Administered 2012-12-19 (×2): via INTRAVENOUS

## 2012-12-19 MED ORDER — MIDAZOLAM HCL 2 MG/2ML IJ SOLN
INTRAMUSCULAR | Status: AC
  Filled 2012-12-19: qty 2

## 2012-12-19 MED ORDER — HYDROMORPHONE HCL PF 1 MG/ML IJ SOLN: 0.2500 mg | INTRAMUSCULAR | Status: DC | PRN

## 2012-12-19 MED ORDER — OXYTOCIN 40 UNITS IN LACTATED RINGERS INFUSION - SIMPLE MED: 62.5000 mL/h | INTRAVENOUS | Status: AC

## 2012-12-19 MED ORDER — TETANUS-DIPHTH-ACELL PERTUSSIS 5-2.5-18.5 LF-MCG/0.5 IM SUSP: 0.5000 mL | Freq: Once | INTRAMUSCULAR | Status: DC

## 2012-12-19 MED ORDER — SENNOSIDES-DOCUSATE SODIUM 8.6-50 MG PO TABS
2.0000 | ORAL_TABLET | Freq: Every day | ORAL | Status: DC
  Administered 2012-12-19 – 2012-12-21 (×3): 2 via ORAL

## 2012-12-19 MED ORDER — DIPHENHYDRAMINE HCL 25 MG PO CAPS: 25.0000 mg | ORAL_CAPSULE | Freq: Four times a day (QID) | ORAL | Status: DC | PRN

## 2012-12-19 MED ORDER — PROPOFOL 10 MG/ML IV EMUL
INTRAVENOUS | Status: AC
  Filled 2012-12-19: qty 20

## 2012-12-19 SURGICAL SUPPLY — 30 items
CLOTH BEACON ORANGE TIMEOUT ST (SAFETY) ×2 IMPLANT
CONTAINER PREFILL 10% NBF 15ML (MISCELLANEOUS) IMPLANT
DRAPE LG THREE QUARTER DISP (DRAPES) ×2 IMPLANT
DRSG COVADERM 4X10 (GAUZE/BANDAGES/DRESSINGS) ×2 IMPLANT
DRSG OPSITE POSTOP 4X10 (GAUZE/BANDAGES/DRESSINGS) IMPLANT
DURAPREP 26ML APPLICATOR (WOUND CARE) ×2 IMPLANT
ELECT REM PT RETURN 9FT ADLT (ELECTROSURGICAL) ×2
ELECTRODE REM PT RTRN 9FT ADLT (ELECTROSURGICAL) ×1 IMPLANT
EXTRACTOR VACUUM KIWI (MISCELLANEOUS) IMPLANT
EXTRACTOR VACUUM M CUP 4 TUBE (SUCTIONS) IMPLANT
GLOVE BIO SURGEON STRL SZ8 (GLOVE) ×2 IMPLANT
GLOVE ORTHO TXT STRL SZ7.5 (GLOVE) ×2 IMPLANT
GOWN PREVENTION PLUS LG XLONG (DISPOSABLE) ×4 IMPLANT
KIT ABG SYR 3ML LUER SLIP (SYRINGE) IMPLANT
NEEDLE HYPO 25X5/8 SAFETYGLIDE (NEEDLE) ×2 IMPLANT
NS IRRIG 1000ML POUR BTL (IV SOLUTION) ×2 IMPLANT
PACK C SECTION WH (CUSTOM PROCEDURE TRAY) ×2 IMPLANT
PAD OB MATERNITY 4.3X12.25 (PERSONAL CARE ITEMS) IMPLANT
RTRCTR C-SECT PINK 25CM LRG (MISCELLANEOUS) ×4 IMPLANT
SLEEVE SCD COMPRESS KNEE MED (MISCELLANEOUS) IMPLANT
STAPLER VISISTAT 35W (STAPLE) IMPLANT
SUT CHROMIC 1 CTX 36 (SUTURE) ×4 IMPLANT
SUT PLAIN 0 NONE (SUTURE) IMPLANT
SUT PLAIN 2 0 XLH (SUTURE) IMPLANT
SUT VIC AB 0 CT1 27 (SUTURE) ×2
SUT VIC AB 0 CT1 27XBRD ANBCTR (SUTURE) ×2 IMPLANT
SUT VIC AB 4-0 KS 27 (SUTURE) IMPLANT
TOWEL OR 17X24 6PK STRL BLUE (TOWEL DISPOSABLE) ×6 IMPLANT
TRAY FOLEY CATH 14FR (SET/KITS/TRAYS/PACK) ×2 IMPLANT
WATER STERILE IRR 1000ML POUR (IV SOLUTION) ×2 IMPLANT

## 2012-12-19 NOTE — H&P (Signed)
Janice Boyd is a 36 y.o. female, G7 P3033, EGA [redacted] weeks with EDC 2-10 presenting for c/o n/v and abdominal pain that started this am, also passed a couple of clots.  On presentation to MAU, pt appeared uncomfortable, FHT 80-90 and not recovering.  Taken to OR for stat c-section.  Prenatal care complicated by previous c-section, scheduled for repeat.  See prenatal records for complete history.    Maternal Medical History:  Fetal activity: Perceived fetal activity is decreased.      OB History    Grav Para Term Preterm Abortions TAB SAB Ect Mult Living   7 4 3 1 3 3  0 0 0 4    SVD at term x 2 c-section at term for arrest of descent Eab x 3  Past Medical History  Diagnosis Date  . Abortion history   . Vaginal bleeding before [redacted] weeks gestation   . Depression    Past Surgical History  Procedure Date  . Cesarean section    Family History: family history is not on file. Social History:  reports that she has been smoking.  She does not have any smokeless tobacco history on file. She reports that she does not drink alcohol. Her drug history not on file.   Prenatal Transfer Tool  Maternal Diabetes: No Genetic Screening: Normal Maternal Ultrasounds/Referrals: Normal Fetal Ultrasounds or other Referrals:  None Maternal Substance Abuse:  Yes:  Type: Marijuana Significant Maternal Medications:  None Significant Maternal Lab Results:  None Other Comments:  stat c-section for abruption  Review of Systems  Respiratory: Negative.   Cardiovascular: Negative.       Last menstrual period 04/25/2012, unknown if currently breastfeeding. Maternal Exam:  Uterine Assessment: Contraction strength is firm.  Contraction frequency is irregular.   Abdomen: Patient reports generalized tenderness.  Surgical scars: low transverse.   Fetal presentation: vertex  Introitus: Normal vulva. Normal vagina.    Fetal Exam Fetal Monitor Review: Mode: ultrasound.   Baseline rate: 80-90.    Variability: minimal (<5 bpm).   Pattern: no accelerations.    Fetal State Assessment: Category III - tracings are abnormal.     Physical Exam  Constitutional: She appears well-developed and well-nourished.  Cardiovascular: Normal rate, regular rhythm and normal heart sounds.   No murmur heard. Respiratory: Effort normal and breath sounds normal.  GI: There is generalized tenderness.    Prenatal labs: ABO, Rh:  A pos Antibody:  neg Rubella:  Imm RPR:   NR HBsAg:   Neg HIV:   Neg GBS:   unknown GCT:  69  Assessment/Plan: IUP at 34 weeks with fetal bradycardia, previous c-section.  To OR for stat c-section.   Ryen Rhames D 12/19/2012, 9:05 AM

## 2012-12-19 NOTE — H&P (Signed)
HPI: Janice Boyd is a 36 y.o. year old G6P3033 female at [redacted]w[redacted]d weeks gestation who presents to MAU reporting nausea, vomiting, diarrhea and low abd pain. Pt was hysterical upon arrival, hyperventilating. FHR found to be in the 80's. Dr. Jackelyn Knife called. Stat C/S called. Pt prepped for OR. Prior C/S 2011 for arrest of descent.   Maternal Medical History:  Reason for admission: Reason for admission: nausea.    OB History    Grav Para Term Preterm Abortions TAB SAB Ect Mult Living   7 3 3  0 3 3 0 0 0 3     Past Medical History  Diagnosis Date  . Abortion history   . Vaginal bleeding before [redacted] weeks gestation   . Depression    Past Surgical History  Procedure Date  . Cesarean section    Family History: family history is not on file. Social History:  reports that she has been smoking.  She does not have any smokeless tobacco history on file. She reports that she does not drink alcohol. Her drug history not on file.  Review of Systems  Unable to perform ROS Gastrointestinal: Positive for nausea, vomiting, abdominal pain and diarrhea.      Last menstrual period 04/25/2012. Maternal Exam:  Introitus: not evaluated.   Cervix: not evaluated.   Fetal Exam Fetal Monitor Review: Mode: ultrasound.   Baseline rate: 80.  Variability: minimal (<5 bpm).    Fetal State Assessment: Category III - tracings are abnormal.     Physical Exam  Constitutional: She is oriented to person, place, and time. She appears well-developed and well-nourished. She appears distressed.  HENT:  Head: Normocephalic.  Neurological: She is alert and oriented to person, place, and time.  Skin: Skin is warm and dry.  Psychiatric: She is agitated.    Prenatal labs: ABO, Rh:  A pos Antibody:  Neg Rubella:  Immune RPR:   NR HBsAg:   Neg HIV:   NR GBS:   Unk First Trimester screen and AFP neg  Assessment: 1. Labor: Unknown 2. Fetal Wellbeing: Category III  3. 34.0 week IUP 4. GBS:  Unknown  Plan:  1. Stat C/S  Janice Boyd, Janice Boyd 12/19/2012, 8:20 AM

## 2012-12-19 NOTE — Op Note (Signed)
Preoperative diagnosis: Intrauterine pregnancy at 34 weeks, fetal bradycardia, previous c-section Postoperative diagnosis: Same, placental abruption Procedure: Repeat stat low transverse cesarean section without extensions Surgeon: Lavina Hamman M.D. Anesthesia: GETA   Findings: Patient had normal gravid anatomy and delivered a viable infant with Apgars, arterial cord pH and weight pending Estimated blood loss: 2000, about 1500cc clot Specimens: Placenta sent for routine pathology Complications: None  Procedure in detail: The patient was taken to the operating room and placed in the dorsosupine position. Abdomen had a quick Betadine prep, draped in the usual fashion.  General anesthesia was induced. Abdomen was entered via a standard Pfannenstiel incision through her previous scar. Once the peritoneal cavity was entered a bladder blade was placed and good visualization was achieved. A 4 cm transverse incision was then made in the lower uterine segment pushing the bladder inferior. Once the uterine cavity was entered the incision was extended digitally. The fetal vertex was grasped and delivered through the incision atraumatically. The remainder of the infant then delivered atraumatically. Cord was doubly clamped and cut and the infant handed to the awaiting pediatric team. Cord blood was obtained, arterial cord pH was obtained. The placenta delivered spontaneously, along with about 1500 cc clot. Uterus was wiped dry with clean lap pad and all clots and debris were removed. Uterine incision was inspected and found to be free of extensions. Uterine incision was closed in 1 layer with running locking #1 Chromic. Tubes and ovaries were inspected and found to be normal. Uterine incision was inspected and found to be hemostatic. Bleeding from serosal edges was controlled with electrocautery. Subfascial space was irrigated and made hemostatic with electrocautery. Fascia was closed in running fashion starting at  both ends and meeting in the middle with 0 Vicryl. Subcutaneous tissue was then irrigated and made hemostatic with electrocautery, then closed with running 2-0 plain gut. Skin was closed with staples followed by a sterile dressing. Patient tolerated the procedure well and was taken to the recovery in stable condition. Counts were correct x2, she received Ancef 2 g IV at the beginning of the procedure.  NICU team at delivery, baby taken to the NICU.

## 2012-12-19 NOTE — Anesthesia Preprocedure Evaluation (Signed)
Anesthesia Evaluation  Patient identified by MRN, date of birth, ID band Patient awake    Reviewed: Allergy & Precautions, H&P , NPO status , Patient's Chart, lab work & pertinent test results  Airway Mallampati: III TM Distance: >3 FB Neck ROM: Full    Dental No notable dental hx. (+) Teeth Intact   Pulmonary Current Smoker,  breath sounds clear to auscultation  Pulmonary exam normal       Cardiovascular negative cardio ROS  Rhythm:Regular Rate:Normal     Neuro/Psych PSYCHIATRIC DISORDERS Depression negative neurological ROS     GI/Hepatic Neg liver ROS,   Endo/Other  negative endocrine ROS  Renal/GU negative Renal ROS  negative genitourinary   Musculoskeletal negative musculoskeletal ROS (+)   Abdominal   Peds  Hematology negative hematology ROS (+)   Anesthesia Other Findings   Reproductive/Obstetrics Previous C/Section Fetal Bradycardia Placental Abruption                           Anesthesia Physical Anesthesia Plan  ASA: II and emergent  Anesthesia Plan: General   Post-op Pain Management:    Induction: Intravenous, Rapid sequence and Cricoid pressure planned  Airway Management Planned: Oral ETT  Additional Equipment:   Intra-op Plan:   Post-operative Plan: Extubation in OR  Informed Consent: I have reviewed the patients History and Physical, chart, labs and discussed the procedure including the risks, benefits and alternatives for the proposed anesthesia with the patient or authorized representative who has indicated his/her understanding and acceptance.   Dental advisory given  Plan Discussed with: CRNA, Anesthesiologist and Surgeon  Anesthesia Plan Comments:         Anesthesia Quick Evaluation

## 2012-12-19 NOTE — MAU Note (Signed)
Dr. Jackelyn Knife at bedside, pt's FHR still in 80's, Dr. Jackelyn Knife discussed need for STAT C/S.

## 2012-12-19 NOTE — Anesthesia Postprocedure Evaluation (Signed)
  Anesthesia Post-op Note  Patient: Janice Boyd  Procedure(s) Performed: Procedure(s) (LRB) with comments: CESAREAN SECTION (N/A)  Patient Location: PACU  Anesthesia Type:General  Level of Consciousness: awake, alert  and oriented  Airway and Oxygen Therapy: Patient Spontanous Breathing  Post-op Pain: mild  Post-op Assessment: Post-op Vital signs reviewed, Patient's Cardiovascular Status Stable, Respiratory Function Stable, Patent Airway, No signs of Nausea or vomiting and Pain level controlled  Post-op Vital Signs: Reviewed and stable  Complications: No apparent anesthesia complications

## 2012-12-19 NOTE — MAU Note (Signed)
Went to the lobby to get the patient who was in a wheelchair. Had been vomiting and clothing were wet. Profuse sweating and patient indicated she had not felt fetal movement. States she has been having vomiting and diarrhea through the night Patient taken directly to a room and assisted to change clothing and a gown applied. Fetal monitor on and attempting to obtain fetal heart rate. Rate heard at 89, pulse ox applied to mother's finger. Mother uncomfortable in bed. Second RN in room. Dr Jackelyn Knife notified to come to MAU.

## 2012-12-19 NOTE — Transfer of Care (Signed)
Immediate Anesthesia Transfer of Care Note  Patient: Janice Boyd  Procedure(s) Performed: Procedure(s) (LRB) with comments: CESAREAN SECTION (N/A)  Patient Location: PACU  Anesthesia Type:General  Level of Consciousness: awake, alert  and oriented  Airway & Oxygen Therapy: Patient Spontanous Breathing and Patient connected to nasal cannula oxygen  Post-op Assessment: Report given to PACU RN and Post -op Vital signs reviewed and stable  Post vital signs: stable  Complications: No apparent anesthesia complications

## 2012-12-19 NOTE — Progress Notes (Signed)
UR chart review completed.  

## 2012-12-20 LAB — CBC
HCT: 24.3 % — ABNORMAL LOW (ref 36.0–46.0)
MCHC: 35 g/dL (ref 30.0–36.0)
RDW: 13.3 % (ref 11.5–15.5)
WBC: 16.9 10*3/uL — ABNORMAL HIGH (ref 4.0–10.5)

## 2012-12-20 NOTE — Addendum Note (Signed)
Addendum  created 12/20/12 1428 by Elbert Ewings, CRNA   Modules edited:Notes Section

## 2012-12-20 NOTE — Progress Notes (Signed)
Subjective: Postpartum Day #1: Cesarean Delivery Patient reports incisional pain and tolerating PO.  Baby stable in NICU, extubated.   Objective: Vital signs in last 24 hours: Temp:  [97.5 F (36.4 C)-98.5 F (36.9 C)] 98.3 F (36.8 C) (12/31 0625) Pulse Rate:  [65-86] 72  (12/31 0625) Resp:  [11-19] 18  (12/31 0625) BP: (99-122)/(62-82) 109/68 mmHg (12/31 0625) SpO2:  [98 %-100 %] 98 % (12/31 0625)  Physical Exam:  General: alert Lochia: appropriate Uterine Fundus: firm Incision: healing well   Basename 12/20/12 0517 12/19/12 0850  HGB 8.5* 9.2*  HCT 24.3* 26.6*    Assessment/Plan: Status post Cesarean section. Doing well postoperatively.  Continue current care, ambulate, d/c PCA pump.  Kyran Connaughton D 12/20/2012, 8:13 AM

## 2012-12-20 NOTE — Progress Notes (Signed)
Patient ID: Janice Boyd, female   DOB: April 01, 1976, 36 y.o.   MRN: [id number] Pt c/o ear pain.  Likely due to intubation  AFVSS gen NAD HEENT B TM clear  Baby doing well  In NICU

## 2012-12-20 NOTE — Progress Notes (Signed)
CSW assessment completed.  Full documentation to follow. 

## 2012-12-20 NOTE — Anesthesia Postprocedure Evaluation (Signed)
  Anesthesia Post-op Note  Patient: Janice Boyd  Procedure(s) Performed: Procedure(s) (LRB) with comments: CESAREAN SECTION (N/A)  Patient Location: PACU and Women's Unit  Anesthesia Type:General  Level of Consciousness: awake, alert  and oriented  Airway and Oxygen Therapy: Patient Spontanous Breathing  Post-op Pain: mild  Post-op Assessment: Post-op Vital signs reviewed and Patient's Cardiovascular Status Stable  Post-op Vital Signs: stable  Complications: No apparent anesthesia complications

## 2012-12-21 ENCOUNTER — Encounter (HOSPITAL_COMMUNITY): Payer: Self-pay | Admitting: Obstetrics and Gynecology

## 2012-12-21 MED ORDER — AZITHROMYCIN 500 MG PO TABS
500.0000 mg | ORAL_TABLET | Freq: Every day | ORAL | Status: AC
  Administered 2012-12-21: 500 mg via ORAL
  Filled 2012-12-21: qty 1

## 2012-12-21 MED ORDER — AZITHROMYCIN 250 MG PO TABS
250.0000 mg | ORAL_TABLET | Freq: Every day | ORAL | Status: DC
  Administered 2012-12-22: 250 mg via ORAL
  Filled 2012-12-21 (×2): qty 1

## 2012-12-21 MED ORDER — GUAIFENESIN-CODEINE 100-10 MG/5ML PO SOLN
10.0000 mL | Freq: Four times a day (QID) | ORAL | Status: DC | PRN
  Administered 2012-12-21 – 2012-12-22 (×2): 10 mL via ORAL
  Filled 2012-12-21: qty 10

## 2012-12-21 NOTE — Progress Notes (Signed)
CSW spoke to MOB on phone to follow up on initial assessment and offer support.  MOB states she did fine last night and that she felt much better after getting rest.  She states she and baby are doing great today.  She has no questions or concerns at this time and was appreciative of CSW's call. 
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
## 2012-12-21 NOTE — Progress Notes (Signed)
Subjective: Postpartum Day 2: Cesarean Delivery Patient reports right ear pain and significant cough that is productive of green thick mucus.  Neck tender on right.  Incisional pain ok with meds.  Baby stable per pt, on  O2,  Not feeding yet  Objective: Vital signs in last 24 hours: Temp:  [98.1 F (36.7 C)-98.5 F (36.9 C)] 98.1 F (36.7 C) (01/01 0628) Pulse Rate:  [78-89] 78  (01/01 0628) Resp:  [16-18] 16  (01/01 0628) BP: (101-140)/(66-96) 104/72 mmHg (01/01 0628) SpO2:  [99 %-100 %] 99 % (01/01 0628) Weight:  [96.616 kg (213 lb)] 96.616 kg (213 lb) (12/31 1033)  Physical Exam:  General: alert and cooperative Lochia: appropriate Uterine Fundus: firm Incision: C/D/I   Basename 12/20/12 0517 12/19/12 0850  HGB 8.5* 9.2*  HCT 24.3* 26.6*    Assessment/Plan: Pt s/p abruption and stat C/S, baby in NICU--stable Sx c/Boyd URI and will start pt on azithromycin, robitussin with codeine for cough Janice Boyd 12/21/2012, 10:07 AM

## 2012-12-22 MED ORDER — OXYCODONE-ACETAMINOPHEN 5-325 MG PO TABS: 1.0000 | ORAL_TABLET | ORAL | Status: DC | PRN

## 2012-12-22 MED ORDER — AZITHROMYCIN 250 MG PO TABS: ORAL_TABLET | ORAL | Status: DC

## 2012-12-22 MED ORDER — IBUPROFEN 600 MG PO TABS: 600.0000 mg | ORAL_TABLET | Freq: Four times a day (QID) | ORAL | Status: DC

## 2012-12-22 NOTE — Progress Notes (Signed)
POD #3 stat LTCS Doing ok, wants to go home Afeb, VSS Abd- soft, fundus firm, incision intact D/c home

## 2012-12-22 NOTE — Progress Notes (Signed)
CSW checked in with MOB at baby's bedside to see how things are going today.  MOB was very upbeat and excited to be holding baby for the first time.  She states she is prepared to go home today and will be back to visit frequently.  She is looking forward to seeing her son Janice Boyd and getting into their new routine.  She states no questions or needs at this time and seemed appreciative of CSW's visit. 

## 2012-12-22 NOTE — Discharge Summary (Signed)
Obstetric Discharge Summary Reason for Admission: pain, bleeding Prenatal Procedures: none Intrapartum Procedures: cesarean: low cervical, transverse, STAT Postpartum Procedures: none Complications-Operative and Postpartum: none Hemoglobin  Date Value Range Status  12/20/2012 8.5* 12.0 - 15.0 g/dL Final     HCT  Date Value Range Status  12/20/2012 24.3* 36.0 - 46.0 % Final    Physical Exam:  General: alert Lochia: appropriate Uterine Fundus: firm Incision: healing well  Discharge Diagnoses: Placental abruption at 34 weeks, previous cesarean section  Discharge Information: Date: 12/22/2012 Activity: pelvic rest and no strenuous activity Diet: routine Medications: Ibuprofen, Percocet and Zithromax Condition: stable Instructions: refer to practice specific booklet Discharge to: home Follow-up Information    Follow up with Janice Boyd D, MD. Schedule an appointment as soon as possible for a visit in 4 days. (for staple removal)    Contact information:   [address] [place] [address] [phone]          Newborn Data: Live born female  Birth Weight: 5 lb 6.8 oz (2462 g) APGAR: 3, 5   Baby stable in NICU.  Marcelline Temkin D 12/22/2012, 9:29 AM

## 2013-01-27 ENCOUNTER — Encounter (HOSPITAL_COMMUNITY): Payer: Self-pay

## 2013-01-27 ENCOUNTER — Inpatient Hospital Stay (HOSPITAL_COMMUNITY): Admit: 2013-01-27 | Payer: Self-pay | Admitting: Obstetrics and Gynecology

## 2013-01-27 SURGERY — Surgical Case
Anesthesia: Choice

## 2013-03-15 ENCOUNTER — Encounter (HOSPITAL_COMMUNITY): Payer: Self-pay | Admitting: Pharmacist

## 2013-03-16 ENCOUNTER — Encounter (HOSPITAL_COMMUNITY): Payer: Self-pay | Admitting: Anesthesiology

## 2013-03-16 NOTE — Anesthesia Preprocedure Evaluation (Deleted)
Anesthesia Evaluation  Patient identified by MRN, date of birth, ID band Patient awake    Reviewed: Allergy & Precautions, H&P , NPO status , Patient's Chart, lab work & pertinent test results  Airway       Dental   Pulmonary neg pulmonary ROS,          Cardiovascular     Neuro/Psych PSYCHIATRIC DISORDERS Depression negative neurological ROS     GI/Hepatic negative GI ROS, Neg liver ROS,   Endo/Other  negative endocrine ROS  Renal/GU negative Renal ROS  negative genitourinary   Musculoskeletal negative musculoskeletal ROS (+)   Abdominal   Peds  Hematology negative hematology ROS (+)   Anesthesia Other Findings   Reproductive/Obstetrics Previous C/Section Desires Sterilization                           Anesthesia Physical Anesthesia Plan  ASA: II  Anesthesia Plan: General   Post-op Pain Management:    Induction: Intravenous  Airway Management Planned: Oral ETT  Additional Equipment:   Intra-op Plan:   Post-operative Plan: Extubation in OR  Informed Consent: I have reviewed the patients History and Physical, chart, labs and discussed the procedure including the risks, benefits and alternatives for the proposed anesthesia with the patient or authorized representative who has indicated his/her understanding and acceptance.   Dental advisory given  Plan Discussed with: CRNA, Anesthesiologist and Surgeon  Anesthesia Plan Comments:         Anesthesia Quick Evaluation

## 2013-03-17 ENCOUNTER — Ambulatory Visit (HOSPITAL_COMMUNITY)
Admission: RE | Admit: 2013-03-17 | Payer: Medicaid Other | Source: Ambulatory Visit | Admitting: Obstetrics and Gynecology

## 2013-03-17 ENCOUNTER — Encounter (HOSPITAL_COMMUNITY): Admission: RE | Payer: Self-pay | Source: Ambulatory Visit

## 2013-03-17 SURGERY — LIGATION, FALLOPIAN TUBE, LAPAROSCOPIC
Anesthesia: General | Laterality: Bilateral

## 2013-03-17 MED ORDER — MUPIROCIN 2 % EX OINT
TOPICAL_OINTMENT | CUTANEOUS | Status: AC
  Filled 2013-03-17: qty 22

## 2013-04-14 ENCOUNTER — Encounter (HOSPITAL_COMMUNITY)
Admission: RE | Admit: 2013-04-14 | Discharge: 2013-04-14 | Disposition: A | Discharge status: Home / Self Care | Payer: Medicaid Other | Source: Ambulatory Visit | Attending: Obstetrics and Gynecology | Admitting: Obstetrics and Gynecology

## 2013-04-14 ENCOUNTER — Encounter (HOSPITAL_COMMUNITY): Payer: Self-pay

## 2013-04-14 HISTORY — DX: Other specified postprocedural states: R11.2

## 2013-04-14 HISTORY — DX: Headache: R51

## 2013-04-14 HISTORY — DX: Other specified postprocedural states: Z98.890

## 2013-04-14 HISTORY — DX: Unspecified convulsions: R56.9

## 2013-04-14 LAB — CBC
HCT: 37.4 % (ref 36.0–46.0)
MCV: 82.6 fL (ref 78.0–100.0)
Platelets: 194 10*3/uL (ref 150–400)
RBC: 4.53 MIL/uL (ref 3.87–5.11)
WBC: 7.6 10*3/uL (ref 4.0–10.5)

## 2013-04-14 MED ORDER — LACTATED RINGERS IV SOLN: INTRAVENOUS | Status: DC

## 2013-04-14 MED ORDER — MUPIROCIN 2 % EX OINT: TOPICAL_OINTMENT | Freq: Two times a day (BID) | CUTANEOUS | Status: DC

## 2013-04-14 NOTE — Patient Instructions (Signed)
   Your procedure is scheduled [id number] April 29th  Enter through the Main Entrance of [facility] at: 9am Pick up the phone at the desk and dial [phone] and inform us of your arrival.  Please call this number if you have any problems the morning of surgery: [phone]  Remember: Do not eat or drink anything after midnight on Monday   Do not wear jewelry, make-up, or FINGER nail polish No metal in your hair or on your body. Do not wear lotions, powders, perfumes. You may wear deodorant.  Please use your CHG wash as directed prior to surgery.  Do not shave anywhere for at least 12 hours prior to first CHG shower.  Do not bring valuables to the hospital. Contacts, dentures or bridgework may not be worn into surgery.   Patients discharged on the day of surgery will not be allowed to drive home.

## 2013-04-18 ENCOUNTER — Encounter (HOSPITAL_COMMUNITY)
Admission: RE | Disposition: A | Discharge status: Home / Self Care | Payer: Self-pay | Source: Ambulatory Visit | Attending: Obstetrics and Gynecology

## 2013-04-18 ENCOUNTER — Encounter (HOSPITAL_COMMUNITY): Payer: Self-pay | Admitting: Anesthesiology

## 2013-04-18 ENCOUNTER — Ambulatory Visit (HOSPITAL_COMMUNITY)
Admission: RE | Admit: 2013-04-18 | Discharge: 2013-04-18 | Disposition: A | Discharge status: Home / Self Care | Payer: Medicaid Other | Source: Ambulatory Visit | Attending: Obstetrics and Gynecology | Admitting: Obstetrics and Gynecology

## 2013-04-18 ENCOUNTER — Ambulatory Visit (HOSPITAL_COMMUNITY): Payer: Medicaid Other | Admitting: Anesthesiology

## 2013-04-18 DIAGNOSIS — Z302 Encounter for sterilization: Secondary | ICD-10-CM | POA: Insufficient documentation

## 2013-04-18 DIAGNOSIS — Z30432 Encounter for removal of intrauterine contraceptive device: Secondary | ICD-10-CM | POA: Diagnosis not present

## 2013-04-18 DIAGNOSIS — Z309 Encounter for contraceptive management, unspecified: Secondary | ICD-10-CM

## 2013-04-18 HISTORY — PX: LAPAROSCOPIC TUBAL LIGATION: SHX1937

## 2013-04-18 LAB — HCG, SERUM, QUALITATIVE: Preg, Serum: NEGATIVE

## 2013-04-18 SURGERY — LIGATION, FALLOPIAN TUBE, LAPAROSCOPIC
Anesthesia: General | Site: Abdomen | Laterality: Bilateral | Wound class: Clean Contaminated

## 2013-04-18 MED ORDER — PROPOFOL 10 MG/ML IV BOLUS
INTRAVENOUS | Status: DC | PRN
  Administered 2013-04-18: 30 mg via INTRAVENOUS
  Administered 2013-04-18: 170 mg via INTRAVENOUS
  Administered 2013-04-18: 50 mg via INTRAVENOUS

## 2013-04-18 MED ORDER — NEOSTIGMINE METHYLSULFATE 1 MG/ML IJ SOLN
INTRAMUSCULAR | Status: AC
  Filled 2013-04-18: qty 1

## 2013-04-18 MED ORDER — GLYCOPYRROLATE 0.2 MG/ML IJ SOLN
INTRAMUSCULAR | Status: AC
  Filled 2013-04-18: qty 4

## 2013-04-18 MED ORDER — FENTANYL CITRATE 0.05 MG/ML IJ SOLN
INTRAMUSCULAR | Status: AC
  Filled 2013-04-18: qty 2

## 2013-04-18 MED ORDER — FENTANYL CITRATE 0.05 MG/ML IJ SOLN
INTRAMUSCULAR | Status: AC
  Filled 2013-04-18: qty 5

## 2013-04-18 MED ORDER — LACTATED RINGERS IV SOLN
INTRAVENOUS | Status: DC
  Administered 2013-04-18 (×2): via INTRAVENOUS

## 2013-04-18 MED ORDER — GLYCOPYRROLATE 0.2 MG/ML IJ SOLN
INTRAMUSCULAR | Status: AC
  Filled 2013-04-18: qty 1

## 2013-04-18 MED ORDER — LACTATED RINGERS IV SOLN
INTRAVENOUS | Status: DC
  Administered 2013-04-18: 1000 mL via INTRAVENOUS

## 2013-04-18 MED ORDER — ACETAMINOPHEN 160 MG/5ML PO SOLN: 975.0000 mg | Freq: Once | ORAL | Status: AC

## 2013-04-18 MED ORDER — DEXAMETHASONE SODIUM PHOSPHATE 10 MG/ML IJ SOLN
INTRAMUSCULAR | Status: AC
  Filled 2013-04-18: qty 1

## 2013-04-18 MED ORDER — SODIUM CHLORIDE 0.9 % IJ SOLN
INTRAMUSCULAR | Status: DC | PRN
  Administered 2013-04-18: 10 mL

## 2013-04-18 MED ORDER — FENTANYL CITRATE 0.05 MG/ML IJ SOLN
INTRAMUSCULAR | Status: DC | PRN
  Administered 2013-04-18 (×2): 100 ug via INTRAVENOUS
  Administered 2013-04-18: 50 ug via INTRAVENOUS

## 2013-04-18 MED ORDER — PROPOFOL 10 MG/ML IV EMUL
INTRAVENOUS | Status: AC
  Filled 2013-04-18: qty 20

## 2013-04-18 MED ORDER — LIDOCAINE HCL (CARDIAC) 20 MG/ML IV SOLN
INTRAVENOUS | Status: AC
  Filled 2013-04-18: qty 5

## 2013-04-18 MED ORDER — ROCURONIUM BROMIDE 50 MG/5ML IV SOLN
INTRAVENOUS | Status: AC
  Filled 2013-04-18: qty 1

## 2013-04-18 MED ORDER — GLYCOPYRROLATE 0.2 MG/ML IJ SOLN
INTRAMUSCULAR | Status: DC | PRN
  Administered 2013-04-18: .6 mg via INTRAVENOUS
  Administered 2013-04-18: 0.1 mg via INTRAVENOUS

## 2013-04-18 MED ORDER — ONDANSETRON HCL 4 MG/2ML IJ SOLN
INTRAMUSCULAR | Status: DC | PRN
  Administered 2013-04-18: 4 mg via INTRAVENOUS

## 2013-04-18 MED ORDER — HYDROMORPHONE HCL PF 1 MG/ML IJ SOLN
INTRAMUSCULAR | Status: DC | PRN
  Administered 2013-04-18 (×2): 0.5 mg via INTRAVENOUS

## 2013-04-18 MED ORDER — ONDANSETRON HCL 4 MG/2ML IJ SOLN
INTRAMUSCULAR | Status: AC
  Filled 2013-04-18: qty 2

## 2013-04-18 MED ORDER — BUPIVACAINE HCL (PF) 0.25 % IJ SOLN
INTRAMUSCULAR | Status: DC | PRN
  Administered 2013-04-18: 9 mL

## 2013-04-18 MED ORDER — ROCURONIUM BROMIDE 100 MG/10ML IV SOLN: INTRAVENOUS | Status: DC | PRN

## 2013-04-18 MED ORDER — DEXAMETHASONE SODIUM PHOSPHATE 10 MG/ML IJ SOLN
INTRAMUSCULAR | Status: DC | PRN
  Administered 2013-04-18: 10 mg via INTRAVENOUS

## 2013-04-18 MED ORDER — BUPIVACAINE HCL (PF) 0.25 % IJ SOLN
INTRAMUSCULAR | Status: AC
  Filled 2013-04-18: qty 30

## 2013-04-18 MED ORDER — OXYCODONE-ACETAMINOPHEN 5-325 MG PO TABS
ORAL_TABLET | ORAL | Status: AC
  Filled 2013-04-18: qty 2

## 2013-04-18 MED ORDER — LIDOCAINE HCL (CARDIAC) 20 MG/ML IV SOLN
INTRAVENOUS | Status: DC | PRN
  Administered 2013-04-18: 80 mg via INTRAVENOUS

## 2013-04-18 MED ORDER — ROCURONIUM BROMIDE 100 MG/10ML IV SOLN
INTRAVENOUS | Status: DC | PRN
  Administered 2013-04-18: 25 mg via INTRAVENOUS
  Administered 2013-04-18: 5 mg via INTRAVENOUS

## 2013-04-18 MED ORDER — OXYCODONE-ACETAMINOPHEN 5-325 MG PO TABS: 1.0000 | ORAL_TABLET | ORAL | Status: DC | PRN

## 2013-04-18 MED ORDER — MIDAZOLAM HCL 5 MG/5ML IJ SOLN
INTRAMUSCULAR | Status: DC | PRN
  Administered 2013-04-18: 2 mg via INTRAVENOUS

## 2013-04-18 MED ORDER — NEOSTIGMINE METHYLSULFATE 1 MG/ML IJ SOLN
INTRAMUSCULAR | Status: DC | PRN
  Administered 2013-04-18: 3 mg via INTRAVENOUS

## 2013-04-18 MED ORDER — KETOROLAC TROMETHAMINE 30 MG/ML IJ SOLN
INTRAMUSCULAR | Status: AC
  Filled 2013-04-18: qty 1

## 2013-04-18 MED ORDER — FENTANYL CITRATE 0.05 MG/ML IJ SOLN
25.0000 ug | INTRAMUSCULAR | Status: DC | PRN
  Administered 2013-04-18: 25 ug via INTRAVENOUS

## 2013-04-18 MED ORDER — MIDAZOLAM HCL 2 MG/2ML IJ SOLN
INTRAMUSCULAR | Status: AC
  Filled 2013-04-18: qty 2

## 2013-04-18 MED ORDER — KETOROLAC TROMETHAMINE 30 MG/ML IJ SOLN
15.0000 mg | Freq: Once | INTRAMUSCULAR | Status: AC | PRN
  Administered 2013-04-18: 30 mg via INTRAVENOUS

## 2013-04-18 MED ORDER — ACETAMINOPHEN 160 MG/5ML PO SOLN
ORAL | Status: AC
  Administered 2013-04-18: 975 mg via ORAL
  Filled 2013-04-18: qty 40.6

## 2013-04-18 SURGICAL SUPPLY — 21 items
CATH ROBINSON RED A/P 16FR (CATHETERS) IMPLANT
CATH SILICONE 16FRX5CC (CATHETERS) ×2 IMPLANT
CHLORAPREP W/TINT 26ML (MISCELLANEOUS) ×2 IMPLANT
CLOTH BEACON ORANGE TIMEOUT ST (SAFETY) ×2 IMPLANT
DERMABOND ADVANCED (GAUZE/BANDAGES/DRESSINGS) ×1
DERMABOND ADVANCED .7 DNX12 (GAUZE/BANDAGES/DRESSINGS) ×1 IMPLANT
GLOVE BIO SURGEON STRL SZ8 (GLOVE) ×2 IMPLANT
GLOVE ORTHO TXT STRL SZ7.5 (GLOVE) ×2 IMPLANT
GOWN PREVENTION PLUS LG XLONG (DISPOSABLE) ×2 IMPLANT
GOWN PREVENTION PLUS XLARGE (GOWN DISPOSABLE) ×2 IMPLANT
NEEDLE INSUFFLATION 120MM (ENDOMECHANICALS) ×2 IMPLANT
PACK LAPAROSCOPY BASIN (CUSTOM PROCEDURE TRAY) ×2 IMPLANT
SUT VIC AB 3-0 CTX 36 (SUTURE) IMPLANT
SUT VIC AB 3-0 PS2 18 (SUTURE) ×1
SUT VIC AB 3-0 PS2 18XBRD (SUTURE) ×1 IMPLANT
SUT VICRYL 0 UR6 27IN ABS (SUTURE) IMPLANT
TOWEL OR 17X24 6PK STRL BLUE (TOWEL DISPOSABLE) ×4 IMPLANT
TROCAR OPTI TIP 5M 100M (ENDOMECHANICALS) ×2 IMPLANT
TROCAR XCEL NON-BLD 11X100MML (ENDOMECHANICALS) ×2 IMPLANT
WARMER LAPAROSCOPE (MISCELLANEOUS) ×2 IMPLANT
WATER STERILE IRR 1000ML POUR (IV SOLUTION) ×2 IMPLANT
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
## 2013-04-18 NOTE — Transfer of Care (Signed)
Immediate Anesthesia Transfer of Care Note  Patient: Janice Boyd  Procedure(s) Performed: Procedure(s): LAPAROSCOPIC TUBAL LIGATION WITH REMOVAL OF INTRUTERINE DEVICE (Bilateral)  Patient Location: PACU  Anesthesia Type:General  Level of Consciousness: awake, alert  and oriented  Airway & Oxygen Therapy: Patient Spontanous Breathing and Patient connected to nasal cannula oxygen  Post-op Assessment: Report given to PACU RN and Post -op Vital signs reviewed and stable  Post vital signs: Reviewed and stable  Complications: No apparent anesthesia complications

## 2013-04-18 NOTE — Anesthesia Postprocedure Evaluation (Signed)
  Anesthesia Post-op Note  Anesthesia Post Note  Patient: Janice Boyd  Procedure(s) Performed: Procedure(s) (LRB): LAPAROSCOPIC TUBAL LIGATION WITH REMOVAL OF INTRUTERINE DEVICE (Bilateral)  Anesthesia type: General  Patient location: PACU  Post pain: Pain level controlled  Post assessment: Post-op Vital signs reviewed  Last Vitals:  Filed Vitals:   04/18/13 1230  BP: 104/68  Pulse: 57  Temp:   Resp: 20    Post vital signs: Reviewed  Level of consciousness: sedated  Complications: No apparent anesthesia complications
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
## 2013-04-18 NOTE — Anesthesia Procedure Notes (Signed)
Procedure Name: Intubation Date/Time: 04/18/2013 10:35 AM Performed by: Graciela Husbands Pre-anesthesia Checklist: Suction available, Emergency Drugs available, Timeout performed, Patient identified and Patient being monitored Patient Re-evaluated:Patient Re-evaluated prior to inductionOxygen Delivery Method: Circle system utilized Preoxygenation: Pre-oxygenation with 100% oxygen Intubation Type: IV induction Ventilation: Oral airway inserted - appropriate to patient size and Mask ventilation without difficulty Laryngoscope Size: Mac and 3 Grade View: Grade I Tube size: 7.0 mm Number of attempts: 1 Airway Equipment and Method: Patient positioned with wedge pillow and Stylet Placement Confirmation: ETT inserted through vocal cords under direct vision,  positive ETCO2 and breath sounds checked- equal and bilateral Secured at: 22 cm Tube secured with: Tape Dental Injury: Teeth and Oropharynx as per pre-operative assessment

## 2013-04-18 NOTE — Interval H&P Note (Signed)
History and Physical Interval Note:  04/18/2013 10:18 AM  Janice Boyd  has presented today for surgery, with the diagnosis of sterilization, [phone]  The various methods of treatment have been discussed with the patient and family. After consideration of risks, benefits and other options for treatment, the patient has consented to  Procedure(s): LAPAROSCOPIC TUBAL LIGATION (Bilateral), IUD removal as a surgical intervention .  The patient's history has been reviewed, patient examined, no change in status, stable for surgery.  I have reviewed the patient's chart and labs.  Questions were answered to the patient's satisfaction.     Janice Boyd D

## 2013-04-18 NOTE — Op Note (Signed)
Preoperative diagnosis: Desires surgical sterility Postoperative diagnosis: Same Procedure: Laparoscopic bilateral distal salpingectomy, Removal of Mirena IUD Surgeon: Lavina Hamman M.D. Anesthesia: Gen. Endotracheal tube Findings: She had a normal abdomen and pelvis with normal uterus tubes and ovaries Specimens: Distal end of each fallopian tube Estimated blood loss: Minimal Complications: None  Procedure in detail  The patient was taken to the operating room and placed in the dorsosupine position. General anesthesia was induced. Her legs were placed in mobile stirrups and her left arm was tucked to her side. Abdomen perineum and vagina were then prepped and draped in the usual sterile fashion, a Foley catheter was inserted, a Hulka tenaculum was applied to the cervix for uterine manipulation. Infraumbilical skin was then infiltrated with quarter percent Marcaine and a 1 cm vertical incision was made. The veress needle was inserted into the peritoneal cavity and placement confirmed by the water drop test and an opening pressure of 3 mm of mercury. CO2 was insufflated to a pressure of 10 mm of mercury and the veress needle was removed. A 10/11 disposable trocar was then introduced with direct visualization with the laparoscope. A 5 mm port was then placed in the midline also under direct visualization. Inspection revealed the above-mentioned findings with normal anatomy. The distal end of each tube was grasped and elevated. Using bipolar cautery I was able to free the distal end of each tube from the ovary and fulgurate across a distal portion of the fallopian tube. Scissors were then used to remove the distal portion of the fallopian tube. A small amount of bleeding from the right side was controlled with bipolar cautery. This is done bilaterally without difficulty. Both segments of tube were removed through the umbilical trocar. The 5 mm port was removed under direct visualization. All gas was  allowed to deflate from the abdomen and the umbilical trocar was removed. Skin incisions were then closed with interrupted subcuticular sutures of 4-0 Vicryl followed by Dermabond.  Attention was turned vaginally.  The legs were elevated in the mobile stirrups. The Hulka tenaculum was removed. A Graves speculum was placed into the vagina.  The IUD strings were grasped and the Mirena was removed intact without difficulty.  All instruments were removed from the vagina.  The patient was taken down from stirrups. She was awakened in the operating room and taken to the recovery room in stable condition after tolerating the procedure well. Counts were correct and she had PAS hose on throughout the procedure.

## 2013-04-19 ENCOUNTER — Encounter (HOSPITAL_COMMUNITY): Payer: Self-pay | Admitting: Obstetrics and Gynecology

## 2013-07-14 ENCOUNTER — Inpatient Hospital Stay (HOSPITAL_COMMUNITY): Payer: Medicaid Other

## 2013-07-14 ENCOUNTER — Inpatient Hospital Stay (HOSPITAL_COMMUNITY)
Admission: AD | Admit: 2013-07-14 | Discharge: 2013-07-15 | Disposition: A | Discharge status: Home / Self Care | Payer: Medicaid Other | Source: Ambulatory Visit | Attending: Obstetrics and Gynecology | Admitting: Obstetrics and Gynecology

## 2013-07-14 ENCOUNTER — Encounter (HOSPITAL_COMMUNITY): Payer: Self-pay

## 2013-07-14 DIAGNOSIS — R102 Pelvic and perineal pain: Secondary | ICD-10-CM

## 2013-07-14 DIAGNOSIS — R112 Nausea with vomiting, unspecified: Secondary | ICD-10-CM | POA: Insufficient documentation

## 2013-07-14 DIAGNOSIS — N938 Other specified abnormal uterine and vaginal bleeding: Secondary | ICD-10-CM | POA: Insufficient documentation

## 2013-07-14 DIAGNOSIS — N949 Unspecified condition associated with female genital organs and menstrual cycle: Secondary | ICD-10-CM | POA: Insufficient documentation

## 2013-07-14 LAB — WET PREP, GENITAL
Clue Cells Wet Prep HPF POC: NONE SEEN
Trich, Wet Prep: NONE SEEN

## 2013-07-14 LAB — CBC
Hemoglobin: 13.5 g/dL (ref 12.0–15.0)
MCHC: 35.3 g/dL (ref 30.0–36.0)
RDW: 13.7 % (ref 11.5–15.5)

## 2013-07-14 LAB — ABO/RH: ABO/RH(D): A POS

## 2013-07-14 LAB — HCG, QUANTITATIVE, PREGNANCY: hCG, Beta Chain, Quant, S: <1 m[IU]/mL (ref <5)

## 2013-07-14 MED ORDER — LACTATED RINGERS IV BOLUS (SEPSIS)
1000.0000 mL | Freq: Once | INTRAVENOUS | Status: AC
  Administered 2013-07-14: 1000 mL via INTRAVENOUS

## 2013-07-14 MED ORDER — ONDANSETRON HCL 4 MG/2ML IJ SOLN: 8.0000 mg | Freq: Once | INTRAMUSCULAR | Status: DC

## 2013-07-14 MED ORDER — ONDANSETRON 8 MG/NS 50 ML IVPB
8.0000 mg | Freq: Once | INTRAVENOUS | Status: AC
  Administered 2013-07-14: 8 mg via INTRAVENOUS
  Filled 2013-07-14: qty 8

## 2013-07-14 MED ORDER — HYDROMORPHONE HCL PF 1 MG/ML IJ SOLN
1.0000 mg | Freq: Once | INTRAMUSCULAR | Status: AC
  Administered 2013-07-14: 1 mg via INTRAVENOUS
  Filled 2013-07-14: qty 1
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
## 2013-07-14 NOTE — MAU Note (Signed)
Vomited in Triage

## 2013-07-14 NOTE — MAU Note (Signed)
Had tubes tied in May. Had cycle one day in June. Diarrhea yesterday and today. Today n/v. Severe abdominal pain.

## 2013-07-15 DIAGNOSIS — R112 Nausea with vomiting, unspecified: Secondary | ICD-10-CM

## 2013-07-15 LAB — GC/CHLAMYDIA PROBE AMP
CT Probe RNA: NEGATIVE
GC Probe RNA: NEGATIVE

## 2014-08-16 ENCOUNTER — Emergency Department (HOSPITAL_COMMUNITY)
Admission: EM | Admit: 2014-08-16 | Discharge: 2014-08-16 | Disposition: A | Discharge status: Home / Self Care | Payer: Medicaid Other | Attending: Emergency Medicine | Admitting: Emergency Medicine

## 2014-08-16 ENCOUNTER — Encounter (HOSPITAL_COMMUNITY): Payer: Self-pay | Admitting: Emergency Medicine

## 2014-08-16 ENCOUNTER — Emergency Department (HOSPITAL_COMMUNITY): Payer: Medicaid Other

## 2014-08-16 DIAGNOSIS — M542 Cervicalgia: Secondary | ICD-10-CM | POA: Insufficient documentation

## 2014-08-16 DIAGNOSIS — M7989 Other specified soft tissue disorders: Secondary | ICD-10-CM | POA: Diagnosis not present

## 2014-08-16 DIAGNOSIS — R079 Chest pain, unspecified: Secondary | ICD-10-CM | POA: Insufficient documentation

## 2014-08-16 DIAGNOSIS — Z87891 Personal history of nicotine dependence: Secondary | ICD-10-CM | POA: Insufficient documentation

## 2014-08-16 DIAGNOSIS — Z91040 Latex allergy status: Secondary | ICD-10-CM | POA: Diagnosis not present

## 2014-08-16 DIAGNOSIS — M940 Chondrocostal junction syndrome [Tietze]: Secondary | ICD-10-CM | POA: Insufficient documentation

## 2014-08-16 DIAGNOSIS — M549 Dorsalgia, unspecified: Secondary | ICD-10-CM | POA: Insufficient documentation

## 2014-08-16 DIAGNOSIS — Z79899 Other long term (current) drug therapy: Secondary | ICD-10-CM | POA: Diagnosis not present

## 2014-08-16 DIAGNOSIS — R509 Fever, unspecified: Secondary | ICD-10-CM | POA: Insufficient documentation

## 2014-08-16 DIAGNOSIS — R05 Cough: Secondary | ICD-10-CM | POA: Diagnosis not present

## 2014-08-16 DIAGNOSIS — R059 Cough, unspecified: Secondary | ICD-10-CM | POA: Diagnosis not present

## 2014-08-16 DIAGNOSIS — Z3202 Encounter for pregnancy test, result negative: Secondary | ICD-10-CM | POA: Insufficient documentation

## 2014-08-16 LAB — COMPREHENSIVE METABOLIC PANEL
ALK PHOS: 53 U/L (ref 39–117)
ALT: 11 U/L (ref 0–35)
AST: 15 U/L (ref 0–37)
Albumin: 3.9 g/dL (ref 3.5–5.2)
Anion gap: 13 (ref 5–15)
BILIRUBIN TOTAL: 0.3 mg/dL (ref 0.3–1.2)
BUN: 13 mg/dL (ref 6–23)
CALCIUM: 9.1 mg/dL (ref 8.4–10.5)
CO2: 24 meq/L (ref 19–32)
CREATININE: 0.75 mg/dL (ref 0.50–1.10)
Chloride: 103 mEq/L (ref 96–112)
GFR calc Af Amer: >90 mL/min (ref >90)
GFR calc non Af Amer: >90 mL/min (ref >90)
GLUCOSE: 84 mg/dL (ref 70–99)
Potassium: 4.2 mEq/L (ref 3.7–5.3)
Sodium: 140 mEq/L (ref 137–147)
TOTAL PROTEIN: 7.4 g/dL (ref 6.0–8.3)

## 2014-08-16 LAB — PREGNANCY, URINE: PREG TEST UR: NEGATIVE

## 2014-08-16 LAB — URINALYSIS, ROUTINE W REFLEX MICROSCOPIC
Bilirubin Urine: NEGATIVE
GLUCOSE, UA: NEGATIVE mg/dL
HGB URINE DIPSTICK: NEGATIVE
Ketones, ur: NEGATIVE mg/dL
LEUKOCYTES UA: NEGATIVE
Nitrite: NEGATIVE
Protein, ur: NEGATIVE mg/dL
SPECIFIC GRAVITY, URINE: 1.01 (ref 1.005–1.030)
UROBILINOGEN UA: 0.2 mg/dL (ref 0.0–1.0)
pH: 7 (ref 5.0–8.0)

## 2014-08-16 LAB — CBC WITH DIFFERENTIAL/PLATELET
Basophils Absolute: 0 10*3/uL (ref 0.0–0.1)
Basophils Relative: 0 % (ref 0–1)
EOS ABS: 0.3 10*3/uL (ref 0.0–0.7)
EOS PCT: 5 % (ref 0–5)
HEMATOCRIT: 37 % (ref 36.0–46.0)
Hemoglobin: 12.9 g/dL (ref 12.0–15.0)
LYMPHS ABS: 2.7 10*3/uL (ref 0.7–4.0)
LYMPHS PCT: 38 % (ref 12–46)
MCH: 30.7 pg (ref 26.0–34.0)
MCHC: 34.9 g/dL (ref 30.0–36.0)
MCV: 88.1 fL (ref 78.0–100.0)
MONO ABS: 0.5 10*3/uL (ref 0.1–1.0)
MONOS PCT: 7 % (ref 3–12)
Neutro Abs: 3.5 10*3/uL (ref 1.7–7.7)
Neutrophils Relative %: 50 % (ref 43–77)
PLATELETS: 186 10*3/uL (ref 150–400)
RBC: 4.2 MIL/uL (ref 3.87–5.11)
RDW: 13.1 % (ref 11.5–15.5)
WBC: 7.1 10*3/uL (ref 4.0–10.5)

## 2014-08-16 LAB — I-STAT TROPONIN, ED: Troponin i, poc: 0 ng/mL (ref 0.00–0.08)

## 2014-08-16 MED ORDER — IBUPROFEN 600 MG PO TABS: 600.0000 mg | ORAL_TABLET | Freq: Three times a day (TID) | ORAL | Status: DC | PRN

## 2014-08-16 MED ORDER — CYCLOBENZAPRINE HCL 5 MG PO TABS: 5.0000 mg | ORAL_TABLET | Freq: Three times a day (TID) | ORAL | Status: DC | PRN

## 2014-08-16 MED ORDER — KETOROLAC TROMETHAMINE 30 MG/ML IJ SOLN
30.0000 mg | Freq: Once | INTRAMUSCULAR | Status: AC
  Administered 2014-08-16: 30 mg via INTRAVENOUS
  Filled 2014-08-16: qty 1

## 2014-08-16 MED ORDER — SODIUM CHLORIDE 0.9 % IV BOLUS (SEPSIS)
1000.0000 mL | Freq: Once | INTRAVENOUS | Status: AC
  Administered 2014-08-16: 1000 mL via INTRAVENOUS
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
## 2014-08-16 NOTE — ED Notes (Signed)
Pt reports burning in chest has subsided; chest pain described as an aching pain

## 2014-08-16 NOTE — ED Notes (Signed)
Family at bedside. 

## 2014-08-16 NOTE — ED Notes (Signed)
The patient said she has been playing softball for four months and she is " sore and burning in her left side of chest".  Pain with breathing or if she moves her arm.  Patient says she is weak but denies any other symptoms.  She says she can reproduce her pain if she touches her chest.   She rates her pain 10/10 if she touches her chest.  If not, it is 6/10.

## 2014-08-16 NOTE — ED Provider Notes (Signed)
I saw and evaluated the patient, reviewed the resident's note and I agree with the findings and plan.   EKG Interpretation   Date/Time:  Thursday August 16 2014 12:57:34 EDT Ventricular Rate:  59 PR Interval:  171 QRS Duration: 81 QT Interval:  429 QTC Calculation: 425 R Axis:   68 Text Interpretation:  Sinus rhythm No previous ECGs available Confirmed by  YAO  MD, DAVID ([id number]) on 08/16/2014 1:09:28 PM      Janice Boyd is a 38 y.o. female here with 6 months of intermittent chest pain. L sided chest pain, worse with movement. She plays a lot of softball. Intermittent subjective shortness of breath. Not on OCP. Not hypoxic or tachycardic. Reproducible L side chest tenderness. Lungs clear. Abdomen soft and nontender. I doubt ACS and trop neg x 1 (symptoms for months). I doubt PE and she is PERC neg. CXR clear. I think likely muscle strain. Will d/c home with flexeril, motrin.     Richardean Canal, MD 08/16/14 1550

## 2014-08-16 NOTE — Discharge Instructions (Signed)
Our tests today do not show any evidence of any major issues with your heart or lungs. We have prescribed Motrin and Flexeril to help with your symptoms. He may experience some drowsiness with Flexeril, so please avoid operating heavy machinery after taking it.
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
## 2014-10-22 ENCOUNTER — Encounter (HOSPITAL_COMMUNITY): Payer: Self-pay | Admitting: Emergency Medicine

## 2015-03-22 ENCOUNTER — Encounter (HOSPITAL_COMMUNITY): Payer: Self-pay

## 2015-03-22 ENCOUNTER — Emergency Department (INDEPENDENT_AMBULATORY_CARE_PROVIDER_SITE_OTHER)
Admission: EM | Admit: 2015-03-22 | Discharge: 2015-03-22 | Disposition: A | Discharge status: Home / Self Care | Payer: Medicaid Other | Source: Home / Self Care

## 2015-03-22 DIAGNOSIS — M7918 Myalgia, other site: Secondary | ICD-10-CM

## 2015-03-22 DIAGNOSIS — M791 Myalgia: Secondary | ICD-10-CM | POA: Diagnosis not present

## 2015-03-22 DIAGNOSIS — M5412 Radiculopathy, cervical region: Secondary | ICD-10-CM

## 2015-03-22 MED ORDER — DEXAMETHASONE 4 MG PO TABS
10.0000 mg | ORAL_TABLET | Freq: Once | ORAL | Status: AC
  Administered 2015-03-22: 10 mg via ORAL

## 2015-03-22 MED ORDER — DEXAMETHASONE 4 MG PO TABS
ORAL_TABLET | ORAL | Status: AC
  Filled 2015-03-22: qty 2

## 2015-03-22 MED ORDER — DEXAMETHASONE 2 MG PO TABS
ORAL_TABLET | ORAL | Status: AC
  Filled 2015-03-22: qty 1

## 2015-03-22 MED ORDER — DICLOFENAC SODIUM 75 MG PO TBEC: 75.0000 mg | DELAYED_RELEASE_TABLET | Freq: Two times a day (BID) | ORAL | Status: DC

## 2015-03-22 NOTE — ED Notes (Signed)
[place] herself here from [place] ,after she had abrupt pain onset today left anterior/lateral chest w radiation onto left arm. "Felt as if water was running in my arm" states she did not do any weight lifting or straining in gym. Pain is worse when she tries to move her arm, when she tries to take a deep breath. tearful

## 2015-03-22 NOTE — Discharge Instructions (Signed)
Your symptoms are likely from muscle spasm and cervical or neck pinching of the nerves. You were given a dose of steroid to help with the inflammation and nerve irritation. Please start taking the Voltaren twice a day. Please apply gentle heat, massage, and range of motion exercises multiple times per day to help relieve your symptoms. [facility] for a follow-up appointment in 2-4 weeks if you are not better. Please let pain be her guide with regards to returning to physical activity. There is no sign of heart attack or other significant medical condition.
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
## 2016-03-08 ENCOUNTER — Encounter (HOSPITAL_BASED_OUTPATIENT_CLINIC_OR_DEPARTMENT_OTHER): Payer: Self-pay | Admitting: *Deleted

## 2016-03-08 ENCOUNTER — Emergency Department (HOSPITAL_BASED_OUTPATIENT_CLINIC_OR_DEPARTMENT_OTHER)
Admission: EM | Admit: 2016-03-08 | Discharge: 2016-03-09 | Disposition: A | Discharge status: Home / Self Care | Payer: Medicaid Other | Attending: Emergency Medicine | Admitting: Emergency Medicine

## 2016-03-08 ENCOUNTER — Emergency Department (HOSPITAL_BASED_OUTPATIENT_CLINIC_OR_DEPARTMENT_OTHER): Payer: Medicaid Other

## 2016-03-08 DIAGNOSIS — J111 Influenza due to unidentified influenza virus with other respiratory manifestations: Secondary | ICD-10-CM | POA: Insufficient documentation

## 2016-03-08 DIAGNOSIS — Z3202 Encounter for pregnancy test, result negative: Secondary | ICD-10-CM | POA: Insufficient documentation

## 2016-03-08 DIAGNOSIS — Z87891 Personal history of nicotine dependence: Secondary | ICD-10-CM | POA: Diagnosis not present

## 2016-03-08 DIAGNOSIS — M542 Cervicalgia: Secondary | ICD-10-CM | POA: Insufficient documentation

## 2016-03-08 DIAGNOSIS — E119 Type 2 diabetes mellitus without complications: Secondary | ICD-10-CM | POA: Diagnosis not present

## 2016-03-08 DIAGNOSIS — Z91040 Latex allergy status: Secondary | ICD-10-CM | POA: Insufficient documentation

## 2016-03-08 DIAGNOSIS — R0789 Other chest pain: Secondary | ICD-10-CM | POA: Diagnosis not present

## 2016-03-08 DIAGNOSIS — R39198 Other difficulties with micturition: Secondary | ICD-10-CM | POA: Diagnosis not present

## 2016-03-08 DIAGNOSIS — R509 Fever, unspecified: Secondary | ICD-10-CM | POA: Diagnosis present

## 2016-03-08 DIAGNOSIS — Z791 Long term (current) use of non-steroidal anti-inflammatories (NSAID): Secondary | ICD-10-CM | POA: Insufficient documentation

## 2016-03-08 DIAGNOSIS — Z79899 Other long term (current) drug therapy: Secondary | ICD-10-CM | POA: Diagnosis not present

## 2016-03-08 DIAGNOSIS — R197 Diarrhea, unspecified: Secondary | ICD-10-CM | POA: Insufficient documentation

## 2016-03-08 HISTORY — DX: Type 2 diabetes mellitus without complications: E11.9

## 2016-03-08 LAB — URINALYSIS, ROUTINE W REFLEX MICROSCOPIC
BILIRUBIN URINE: NEGATIVE
Glucose, UA: NEGATIVE mg/dL
KETONES UR: 15 mg/dL — AB
Leukocytes, UA: NEGATIVE
NITRITE: NEGATIVE
PROTEIN: NEGATIVE mg/dL
Specific Gravity, Urine: 1.009 (ref 1.005–1.030)
pH: 5.5 (ref 5.0–8.0)

## 2016-03-08 LAB — URINE MICROSCOPIC-ADD ON: Bacteria, UA: NONE SEEN

## 2016-03-08 LAB — RAPID STREP SCREEN (MED CTR MEBANE ONLY): Streptococcus, Group A Screen (Direct): NEGATIVE

## 2016-03-08 LAB — PREGNANCY, URINE: Preg Test, Ur: NEGATIVE

## 2016-03-08 MED ORDER — ACETAMINOPHEN 325 MG PO TABS
975.0000 mg | ORAL_TABLET | Freq: Once | ORAL | Status: AC
  Administered 2016-03-08: 975 mg via ORAL
  Filled 2016-03-08: qty 3

## 2016-03-08 MED ORDER — ONDANSETRON 8 MG PO TBDP
8.0000 mg | ORAL_TABLET | Freq: Once | ORAL | Status: AC
  Administered 2016-03-08: 8 mg via ORAL
  Filled 2016-03-08: qty 1

## 2016-03-08 MED ORDER — SODIUM CHLORIDE 0.9 % IV BOLUS (SEPSIS)
1000.0000 mL | Freq: Once | INTRAVENOUS | Status: AC
  Administered 2016-03-08: 1000 mL via INTRAVENOUS

## 2016-03-08 MED ORDER — KETOROLAC TROMETHAMINE 30 MG/ML IJ SOLN
30.0000 mg | Freq: Once | INTRAMUSCULAR | Status: AC
  Administered 2016-03-08: 30 mg via INTRAVENOUS
  Filled 2016-03-08: qty 1

## 2016-03-08 MED ORDER — IBUPROFEN 800 MG PO TABS: 800.0000 mg | ORAL_TABLET | Freq: Once | ORAL | Status: DC
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
## 2016-03-08 NOTE — ED Notes (Addendum)
Fever, HA, generalized pain, hot and cold, sore throat, cough, nausea and a little diarrhea. Onset Thursday. Alert, NAD, calm, interactive, speaking in clear complete sentences. (Denies vomiting). Took advil 200mg  at 1630. LS CTA.

## 2016-03-09 MED ORDER — PROMETHAZINE HCL 25 MG PO TABS: 25.0000 mg | ORAL_TABLET | Freq: Four times a day (QID) | ORAL | Status: DC | PRN

## 2016-03-09 MED ORDER — BUTALBITAL-APAP-CAFFEINE 50-325-40 MG PO TABS: 1.0000 | ORAL_TABLET | Freq: Four times a day (QID) | ORAL | Status: AC | PRN

## 2016-03-09 NOTE — Discharge Instructions (Signed)

## 2016-03-11 LAB — CULTURE, GROUP A STREP (THRC)

## 2016-03-19 ENCOUNTER — Emergency Department (HOSPITAL_BASED_OUTPATIENT_CLINIC_OR_DEPARTMENT_OTHER)
Admission: EM | Admit: 2016-03-19 | Discharge: 2016-03-19 | Disposition: A | Discharge status: Home / Self Care | Payer: Medicaid Other | Attending: Emergency Medicine | Admitting: Emergency Medicine

## 2016-03-19 ENCOUNTER — Encounter (HOSPITAL_BASED_OUTPATIENT_CLINIC_OR_DEPARTMENT_OTHER): Payer: Self-pay | Admitting: Emergency Medicine

## 2016-03-19 DIAGNOSIS — Z791 Long term (current) use of non-steroidal anti-inflammatories (NSAID): Secondary | ICD-10-CM | POA: Diagnosis not present

## 2016-03-19 DIAGNOSIS — E119 Type 2 diabetes mellitus without complications: Secondary | ICD-10-CM | POA: Diagnosis not present

## 2016-03-19 DIAGNOSIS — Z91040 Latex allergy status: Secondary | ICD-10-CM | POA: Diagnosis not present

## 2016-03-19 DIAGNOSIS — Z79899 Other long term (current) drug therapy: Secondary | ICD-10-CM | POA: Insufficient documentation

## 2016-03-19 DIAGNOSIS — J069 Acute upper respiratory infection, unspecified: Secondary | ICD-10-CM | POA: Insufficient documentation

## 2016-03-19 DIAGNOSIS — Z87891 Personal history of nicotine dependence: Secondary | ICD-10-CM | POA: Diagnosis not present

## 2016-03-19 DIAGNOSIS — N939 Abnormal uterine and vaginal bleeding, unspecified: Secondary | ICD-10-CM | POA: Diagnosis not present

## 2016-03-19 DIAGNOSIS — J0100 Acute maxillary sinusitis, unspecified: Secondary | ICD-10-CM | POA: Insufficient documentation

## 2016-03-19 DIAGNOSIS — R52 Pain, unspecified: Secondary | ICD-10-CM | POA: Diagnosis present

## 2016-03-19 LAB — BASIC METABOLIC PANEL
ANION GAP: 8 (ref 5–15)
BUN: 9 mg/dL (ref 6–20)
CALCIUM: 9.8 mg/dL (ref 8.9–10.3)
CO2: 23 mmol/L (ref 22–32)
Chloride: 103 mmol/L (ref 101–111)
Creatinine, Ser: 0.82 mg/dL (ref 0.44–1.00)
GLUCOSE: 106 mg/dL — AB (ref 65–99)
Potassium: 3.9 mmol/L (ref 3.5–5.1)
SODIUM: 134 mmol/L — AB (ref 135–145)

## 2016-03-19 LAB — CBC
HEMATOCRIT: 38.8 % (ref 36.0–46.0)
HEMOGLOBIN: 14.3 g/dL (ref 12.0–15.0)
MCH: 31.5 pg (ref 26.0–34.0)
MCHC: 36.9 g/dL — ABNORMAL HIGH (ref 30.0–36.0)
MCV: 85.5 fL (ref 78.0–100.0)
Platelets: 204 10*3/uL (ref 150–400)
RBC: 4.54 MIL/uL (ref 3.87–5.11)
RDW: 13.3 % (ref 11.5–15.5)
WBC: 6.3 10*3/uL (ref 4.0–10.5)

## 2016-03-19 MED ORDER — AZITHROMYCIN 500 MG PO TABS: ORAL_TABLET | ORAL | Status: AC

## 2016-03-19 NOTE — ED Notes (Addendum)
Fever, HA, generalized pain, hot and cold, sore throat, cough, nausea and a little diarrhea. Onset yesterday. The patient went to her MD - yesterday and was told that she had a sinus infection. The patient repots that she continues to have a "low grade temp" at home that tylenol has not helped. Patient states that she feel like this may be related to a "spider bit" where she had a bump on Sunday,but now she is conserned because she started her "cycle" and she is bleeding heavier than normal and she is coughing up blood. She also has a rash to her shoulders. As this RN is trying to triage the patient she comes up with more and more symptoms unable to list all that she mentioned. Patient states that there  Is nothing new today that what she saw her MD for yesterday but her "fever would not come down and the pharmacist told me to stop the meds and come to the ER"

## 2016-03-19 NOTE — Discharge Instructions (Signed)
You will be treated with Azithromycin for concern for sinusitis. If you have worsening fevers, headache, weakness or difficulty keeping oral hydration return to the ED for evaluation. Please follow with your PCP in the next few days
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
## 2018-06-28 ENCOUNTER — Ambulatory Visit (HOSPITAL_BASED_OUTPATIENT_CLINIC_OR_DEPARTMENT_OTHER)
Admission: RE | Admit: 2018-06-28 | Discharge: 2018-06-28 | Disposition: A | Discharge status: Home / Self Care | Payer: Medicaid Other | Source: Ambulatory Visit | Attending: Emergency Medicine | Admitting: Emergency Medicine

## 2018-06-28 ENCOUNTER — Ambulatory Visit (HOSPITAL_BASED_OUTPATIENT_CLINIC_OR_DEPARTMENT_OTHER)
Admit: 2018-06-28 | Discharge: 2018-06-28 | Disposition: A | Discharge status: Home / Self Care | Payer: Medicaid Other | Attending: Emergency Medicine | Admitting: Emergency Medicine

## 2018-06-28 ENCOUNTER — Encounter (HOSPITAL_BASED_OUTPATIENT_CLINIC_OR_DEPARTMENT_OTHER): Payer: Self-pay | Admitting: Emergency Medicine

## 2018-06-28 ENCOUNTER — Emergency Department (HOSPITAL_BASED_OUTPATIENT_CLINIC_OR_DEPARTMENT_OTHER)
Admission: EM | Admit: 2018-06-28 | Discharge: 2018-06-28 | Disposition: A | Discharge status: Home / Self Care | Payer: Medicaid Other | Attending: Emergency Medicine | Admitting: Emergency Medicine

## 2018-06-28 ENCOUNTER — Other Ambulatory Visit: Payer: Self-pay

## 2018-06-28 DIAGNOSIS — E119 Type 2 diabetes mellitus without complications: Secondary | ICD-10-CM | POA: Diagnosis not present

## 2018-06-28 DIAGNOSIS — R102 Pelvic and perineal pain: Secondary | ICD-10-CM | POA: Diagnosis not present

## 2018-06-28 DIAGNOSIS — Z87891 Personal history of nicotine dependence: Secondary | ICD-10-CM | POA: Insufficient documentation

## 2018-06-28 DIAGNOSIS — Z79899 Other long term (current) drug therapy: Secondary | ICD-10-CM | POA: Insufficient documentation

## 2018-06-28 DIAGNOSIS — N839 Noninflammatory disorder of ovary, fallopian tube and broad ligament, unspecified: Secondary | ICD-10-CM | POA: Insufficient documentation

## 2018-06-28 LAB — URINALYSIS, MICROSCOPIC (REFLEX): WBC, UA: NONE SEEN WBC/hpf (ref 0–5)

## 2018-06-28 LAB — URINALYSIS, ROUTINE W REFLEX MICROSCOPIC
Bilirubin Urine: NEGATIVE
GLUCOSE, UA: NEGATIVE mg/dL
Ketones, ur: NEGATIVE mg/dL
LEUKOCYTES UA: NEGATIVE
NITRITE: NEGATIVE
PH: 5.5 (ref 5.0–8.0)
PROTEIN: NEGATIVE mg/dL
Specific Gravity, Urine: 1.02 (ref 1.005–1.030)

## 2018-06-28 LAB — WET PREP, GENITAL
Sperm: NONE SEEN
TRICH WET PREP: NONE SEEN
YEAST WET PREP: NONE SEEN

## 2018-06-28 LAB — PREGNANCY, URINE: Preg Test, Ur: NEGATIVE

## 2018-06-28 MED ORDER — HYDROCODONE-ACETAMINOPHEN 5-325 MG PO TABS
1.0000 | ORAL_TABLET | Freq: Once | ORAL | Status: AC
  Administered 2018-06-28: 1 via ORAL
  Filled 2018-06-28: qty 1

## 2018-06-28 NOTE — ED Provider Notes (Signed)
Patient was given results for her ultrasound.  Informed patient that she needs to follow up with her OB/GYN and primary care doctor for monitoring and further evaluation of this mass.  She reports that she has both and will be able to make an appointment.  OTC pain medicine. Return precautions discussed.    [place] Transvaginal Non-ob  Result Date: 06/28/2018 CLINICAL DATA:  Pelvic pain EXAM: TRANSABDOMINAL AND TRANSVAGINAL ULTRASOUND OF PELVIS TECHNIQUE: Both transabdominal and transvaginal ultrasound examinations of the pelvis were performed. Transabdominal technique was performed for global imaging of the pelvis including uterus, ovaries, adnexal regions, and pelvic cul-de-sac. It was necessary to proceed with endovaginal exam following the transabdominal exam to visualize the endometrium and ovaries. COMPARISON:  None FINDINGS: Uterus Measurements: 9.6 x 4.9 x 5.1 cm. No fibroids or other mass visualized. Endometrium Thickness: 7.1 mm.  No focal abnormality visualized. Right ovary Measurements: 2.7 x 1.4 x 2.8 cm. Normal appearance/no adnexal mass. Left ovary Measurements: 3 x 2.3 x 3 cm. 2.6 x 1.6 cm echogenic mass in the left ovary without internal Doppler flow. Other findings Small pelvic free fluid. IMPRESSION: 1. 2.6 x 1.6 cm echogenic mass in the left ovary. Differential considerations include a ovarian fibroma versus collapsed corpus luteum cyst. If there is further clinical concern, recommend characterization with MRI. Electronically Signed   By: Elige KoHetal  Patel   On: 06/28/2018 10:57   [place] Pelvis (transabdominal Only)  Result Date: 06/28/2018 CLINICAL DATA:  Pelvic pain EXAM: TRANSABDOMINAL AND TRANSVAGINAL ULTRASOUND OF PELVIS TECHNIQUE: Both transabdominal and transvaginal ultrasound examinations of the pelvis were performed. Transabdominal technique was performed for global imaging of the pelvis including uterus, ovaries, adnexal regions, and pelvic cul-de-sac. It was necessary to proceed with  endovaginal exam following the transabdominal exam to visualize the endometrium and ovaries. COMPARISON:  None FINDINGS: Uterus Measurements: 9.6 x 4.9 x 5.1 cm. No fibroids or other mass visualized. Endometrium Thickness: 7.1 mm.  No focal abnormality visualized. Right ovary Measurements: 2.7 x 1.4 x 2.8 cm. Normal appearance/no adnexal mass. Left ovary Measurements: 3 x 2.3 x 3 cm. 2.6 x 1.6 cm echogenic mass in the left ovary without internal Doppler flow. Other findings Small pelvic free fluid. IMPRESSION: 1. 2.6 x 1.6 cm echogenic mass in the left ovary. Differential considerations include a ovarian fibroma versus collapsed corpus luteum cyst. If there is further clinical concern, recommend characterization with MRI. Electronically Signed   By: Elige KoHetal  Patel   On: 06/28/2018 10:57      Cristina GongHammond, Rogelio Winbush W, PA-C 06/28/18 1127    Azalia Bilisampos, Kevin, MD 06/28/18 1640
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
## 2018-06-28 NOTE — ED Triage Notes (Signed)
Pt states she is having pelvic pain that started tonight while she was having intercourse  Pt states the pain is in her pelvic/groin area  Pt states she had some discharge earlier in the day and was having pain in her left side

## 2018-06-29 LAB — GC/CHLAMYDIA PROBE AMP (~~LOC~~) NOT AT ARMC
Chlamydia: NEGATIVE
Neisseria Gonorrhea: NEGATIVE

## 2018-12-27 ENCOUNTER — Other Ambulatory Visit: Payer: Self-pay

## 2018-12-27 ENCOUNTER — Emergency Department (HOSPITAL_BASED_OUTPATIENT_CLINIC_OR_DEPARTMENT_OTHER): Payer: Medicaid Other

## 2018-12-27 ENCOUNTER — Emergency Department (HOSPITAL_BASED_OUTPATIENT_CLINIC_OR_DEPARTMENT_OTHER)
Admission: EM | Admit: 2018-12-27 | Discharge: 2018-12-28 | Disposition: A | Discharge status: Home / Self Care | Payer: Medicaid Other | Attending: Emergency Medicine | Admitting: Emergency Medicine

## 2018-12-27 ENCOUNTER — Encounter (HOSPITAL_BASED_OUTPATIENT_CLINIC_OR_DEPARTMENT_OTHER): Payer: Self-pay

## 2018-12-27 DIAGNOSIS — Y9389 Activity, other specified: Secondary | ICD-10-CM | POA: Insufficient documentation

## 2018-12-27 DIAGNOSIS — S20361A Insect bite (nonvenomous) of right front wall of thorax, initial encounter: Secondary | ICD-10-CM | POA: Insufficient documentation

## 2018-12-27 DIAGNOSIS — Z87891 Personal history of nicotine dependence: Secondary | ICD-10-CM | POA: Diagnosis not present

## 2018-12-27 DIAGNOSIS — E119 Type 2 diabetes mellitus without complications: Secondary | ICD-10-CM | POA: Insufficient documentation

## 2018-12-27 DIAGNOSIS — L0390 Cellulitis, unspecified: Secondary | ICD-10-CM

## 2018-12-27 DIAGNOSIS — W57XXXA Bitten or stung by nonvenomous insect and other nonvenomous arthropods, initial encounter: Secondary | ICD-10-CM | POA: Diagnosis not present

## 2018-12-27 DIAGNOSIS — L03313 Cellulitis of chest wall: Secondary | ICD-10-CM | POA: Diagnosis not present

## 2018-12-27 DIAGNOSIS — M25511 Pain in right shoulder: Secondary | ICD-10-CM | POA: Diagnosis present

## 2018-12-27 DIAGNOSIS — Y998 Other external cause status: Secondary | ICD-10-CM | POA: Insufficient documentation

## 2018-12-27 DIAGNOSIS — Y9289 Other specified places as the place of occurrence of the external cause: Secondary | ICD-10-CM | POA: Diagnosis not present

## 2018-12-27 NOTE — ED Triage Notes (Signed)
C/o ?insect bite to right clavicle area minutes PTA-NAD-steady gait
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
## 2018-12-27 NOTE — ED Notes (Signed)
Pt has a raised area that is swollen and hurting and pt states it is burning at this time. The area is tender to touch

## 2018-12-28 MED ORDER — CEPHALEXIN 500 MG PO CAPS: 500.0000 mg | ORAL_CAPSULE | Freq: Four times a day (QID) | ORAL | 0 refills | Status: DC

## 2018-12-28 MED ORDER — FLUCONAZOLE 150 MG PO TABS: 150.0000 mg | ORAL_TABLET | Freq: Every day | ORAL | 1 refills | Status: DC

## 2018-12-28 NOTE — Discharge Instructions (Signed)
Keflex as prescribed.  Return to the emergency department if symptoms significantly worsen or change.

## 2019-02-03 ENCOUNTER — Other Ambulatory Visit: Payer: Self-pay | Admitting: Physician Assistant

## 2019-02-03 DIAGNOSIS — Z1231 Encounter for screening mammogram for malignant neoplasm of breast: Secondary | ICD-10-CM

## 2019-03-15 ENCOUNTER — Ambulatory Visit: Payer: Medicaid Other

## 2019-05-11 ENCOUNTER — Ambulatory Visit
Admission: RE | Admit: 2019-05-11 | Discharge: 2019-05-11 | Disposition: A | Discharge status: Home / Self Care | Payer: Medicaid Other | Source: Ambulatory Visit | Attending: Physician Assistant | Admitting: Physician Assistant

## 2019-05-11 ENCOUNTER — Other Ambulatory Visit: Payer: Self-pay

## 2019-05-11 DIAGNOSIS — Z1231 Encounter for screening mammogram for malignant neoplasm of breast: Secondary | ICD-10-CM

## 2020-05-07 ENCOUNTER — Other Ambulatory Visit: Payer: Self-pay | Admitting: Family Medicine

## 2020-05-07 DIAGNOSIS — Z1231 Encounter for screening mammogram for malignant neoplasm of breast: Secondary | ICD-10-CM

## 2020-05-13 ENCOUNTER — Ambulatory Visit
Admission: RE | Admit: 2020-05-13 | Discharge: 2020-05-13 | Disposition: A | Discharge status: Home / Self Care | Payer: Medicaid Other | Source: Ambulatory Visit | Attending: Family Medicine | Admitting: Family Medicine

## 2020-05-13 ENCOUNTER — Other Ambulatory Visit: Payer: Self-pay

## 2020-05-13 DIAGNOSIS — Z1231 Encounter for screening mammogram for malignant neoplasm of breast: Secondary | ICD-10-CM

## 2021-01-14 ENCOUNTER — Emergency Department (HOSPITAL_BASED_OUTPATIENT_CLINIC_OR_DEPARTMENT_OTHER): Payer: BC Managed Care – PPO

## 2021-01-14 ENCOUNTER — Encounter (HOSPITAL_BASED_OUTPATIENT_CLINIC_OR_DEPARTMENT_OTHER): Payer: Self-pay | Admitting: *Deleted

## 2021-01-14 ENCOUNTER — Emergency Department (HOSPITAL_BASED_OUTPATIENT_CLINIC_OR_DEPARTMENT_OTHER)
Admission: EM | Admit: 2021-01-14 | Discharge: 2021-01-14 | Disposition: A | Discharge status: Home / Self Care | Payer: BC Managed Care – PPO | Attending: Emergency Medicine | Admitting: Emergency Medicine

## 2021-01-14 ENCOUNTER — Other Ambulatory Visit: Payer: Self-pay

## 2021-01-14 DIAGNOSIS — E119 Type 2 diabetes mellitus without complications: Secondary | ICD-10-CM | POA: Insufficient documentation

## 2021-01-14 DIAGNOSIS — M791 Myalgia, unspecified site: Secondary | ICD-10-CM | POA: Diagnosis not present

## 2021-01-14 DIAGNOSIS — R079 Chest pain, unspecified: Secondary | ICD-10-CM | POA: Insufficient documentation

## 2021-01-14 DIAGNOSIS — Z87891 Personal history of nicotine dependence: Secondary | ICD-10-CM | POA: Diagnosis not present

## 2021-01-14 DIAGNOSIS — R52 Pain, unspecified: Secondary | ICD-10-CM

## 2021-01-14 DIAGNOSIS — Z20822 Contact with and (suspected) exposure to covid-19: Secondary | ICD-10-CM | POA: Insufficient documentation

## 2021-01-14 DIAGNOSIS — Z91040 Latex allergy status: Secondary | ICD-10-CM | POA: Insufficient documentation

## 2021-01-14 DIAGNOSIS — R110 Nausea: Secondary | ICD-10-CM | POA: Diagnosis not present

## 2021-01-14 DIAGNOSIS — R61 Generalized hyperhidrosis: Secondary | ICD-10-CM | POA: Diagnosis not present

## 2021-01-14 LAB — BASIC METABOLIC PANEL
Anion gap: 10 (ref 5–15)
BUN: 14 mg/dL (ref 6–20)
CO2: 23 mmol/L (ref 22–32)
Calcium: 9.8 mg/dL (ref 8.9–10.3)
Chloride: 101 mmol/L (ref 98–111)
Creatinine, Ser: 0.8 mg/dL (ref 0.44–1.00)
GFR, Estimated: >60 mL/min (ref >60)
Glucose, Bld: 92 mg/dL (ref 70–99)
Potassium: 3.9 mmol/L (ref 3.5–5.1)
Sodium: 134 mmol/L — ABNORMAL LOW (ref 135–145)

## 2021-01-14 LAB — HEPATIC FUNCTION PANEL
ALT: 17 U/L (ref 0–44)
AST: 23 U/L (ref 15–41)
Albumin: 5 g/dL (ref 3.5–5.0)
Alkaline Phosphatase: 43 U/L (ref 38–126)
Bilirubin, Direct: 0.1 mg/dL (ref 0.0–0.2)
Indirect Bilirubin: 0.7 mg/dL (ref 0.3–0.9)
Total Bilirubin: 0.8 mg/dL (ref 0.3–1.2)
Total Protein: 8.2 g/dL — ABNORMAL HIGH (ref 6.5–8.1)

## 2021-01-14 LAB — TROPONIN I (HIGH SENSITIVITY)
Troponin I (High Sensitivity): 2 ng/L (ref <18)
Troponin I (High Sensitivity): 3 ng/L (ref <18)

## 2021-01-14 LAB — CBC
HCT: 37.9 % (ref 36.0–46.0)
Hemoglobin: 13.7 g/dL (ref 12.0–15.0)
MCH: 32.2 pg (ref 26.0–34.0)
MCHC: 36.1 g/dL — ABNORMAL HIGH (ref 30.0–36.0)
MCV: 89.2 fL (ref 80.0–100.0)
Platelets: 199 10*3/uL (ref 150–400)
RBC: 4.25 MIL/uL (ref 3.87–5.11)
RDW: 13 % (ref 11.5–15.5)
WBC: 7.3 10*3/uL (ref 4.0–10.5)
nRBC: 0 % (ref 0.0–0.2)

## 2021-01-14 LAB — D-DIMER, QUANTITATIVE: D-Dimer, Quant: 0.53 ug/mL-FEU — ABNORMAL HIGH (ref 0.00–0.50)

## 2021-01-14 LAB — PROTIME-INR
INR: 1 (ref 0.8–1.2)
Prothrombin Time: 12.7 seconds (ref 11.4–15.2)

## 2021-01-14 LAB — PREGNANCY, URINE: Preg Test, Ur: NEGATIVE

## 2021-01-14 LAB — APTT: aPTT: 32 seconds (ref 24–36)

## 2021-01-14 MED ORDER — SODIUM CHLORIDE 0.9 % IV BOLUS
1000.0000 mL | Freq: Once | INTRAVENOUS | Status: AC
  Administered 2021-01-14: 1000 mL via INTRAVENOUS

## 2021-01-14 MED ORDER — KETOROLAC TROMETHAMINE 15 MG/ML IJ SOLN
30.0000 mg | Freq: Once | INTRAMUSCULAR | Status: AC
  Administered 2021-01-14: 30 mg via INTRAVENOUS
  Filled 2021-01-14: qty 2

## 2021-01-14 MED ORDER — IOHEXOL 350 MG/ML SOLN
100.0000 mL | Freq: Once | INTRAVENOUS | Status: AC
  Administered 2021-01-14: 100 mL via INTRAVENOUS

## 2021-01-14 MED ORDER — ORPHENADRINE CITRATE 30 MG/ML IJ SOLN: 60.0000 mg | Freq: Two times a day (BID) | INTRAMUSCULAR | Status: DC

## 2021-01-14 NOTE — ED Triage Notes (Signed)
Chest pain for a week. She has an appointment with a cardiologist in February. She feels a "pinch" in her heart when she moves. Not sleeping or eating. GERD. States she has leg pain. Cold symptoms. States at night she breaks out in a sweat.
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
## 2021-01-14 NOTE — ED Notes (Signed)
U/S at bedside for doppler study

## 2021-01-14 NOTE — ED Notes (Signed)
Patient transported to CT 

## 2021-01-14 NOTE — Discharge Instructions (Signed)
Your work-up has been relatively normal in the emergency department.  Your Covid test is pending.  Please follow-up with this.  Positive to quarantine for 7 days if negative he can return to work.  I suspect that your chest pain is likely musculoskeletal given that the anti-inflammatories help and this is able to be reproduced on palpation of your chest wall.  Take anti-inflammatories at home along with muscle relaxers and warm compresses and lidocaine patches to your chest.  If you develop any worsening symptoms return to your primary care doctor or the ER.

## 2021-01-15 LAB — SARS CORONAVIRUS 2 (TAT 6-24 HRS): SARS Coronavirus 2: NEGATIVE
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
## 2021-01-28 ENCOUNTER — Encounter: Payer: Self-pay | Admitting: Cardiovascular Disease

## 2021-01-28 ENCOUNTER — Ambulatory Visit (INDEPENDENT_AMBULATORY_CARE_PROVIDER_SITE_OTHER): Payer: BC Managed Care – PPO | Admitting: Cardiovascular Disease

## 2021-01-28 ENCOUNTER — Other Ambulatory Visit: Payer: Self-pay

## 2021-01-28 VITALS — BP 81/59 | HR 69 | Ht 67.0 in | Wt 183.4 lb

## 2021-01-28 DIAGNOSIS — Z720 Tobacco use: Secondary | ICD-10-CM | POA: Diagnosis not present

## 2021-01-28 DIAGNOSIS — R079 Chest pain, unspecified: Secondary | ICD-10-CM

## 2021-01-28 DIAGNOSIS — M940 Chondrocostal junction syndrome [Tietze]: Secondary | ICD-10-CM | POA: Diagnosis not present

## 2021-01-28 MED ORDER — IBUPROFEN 800 MG PO TABS: 800.0000 mg | ORAL_TABLET | Freq: Three times a day (TID) | ORAL | 0 refills | Status: AC

## 2021-01-28 NOTE — Patient Instructions (Signed)
Medication Instructions:  Take 800 mg ibuprofen three times a day for 7 days. (please drink plenty of water!)  *If you need a refill on your cardiac medications before your next appointment, please call your pharmacy*  Follow-Up: At [facility], you and your health needs are our priority.  As part of our continuing mission to provide you with exceptional heart care, we have created designated Provider Care Teams.  These Care Teams include your primary Cardiologist (physician) and Advanced Practice Providers (APPs -  Physician Assistants and Nurse Practitioners) who all work together to provide you with the care you need, when you need it.  We recommend signing up for the patient portal called "MyChart".  Sign up information is provided on this After Visit Summary.  MyChart is used to connect with patients for Virtual Visits (Telemedicine).  Patients are able to view lab/test results, encounter notes, upcoming appointments, etc.  Non-urgent messages can be sent to your provider as well.   To learn more about what you can do with MyChart, go to [email or web address].    Your next appointment:   As needed  The format for your next appointment:   In Person  Provider:   Lennie Odor, MD

## 2021-01-31 NOTE — Addendum Note (Signed)
Addended by: Brunetta Genera on: 01/31/2021 04:47 PM   Modules accepted: Orders

## 2021-03-05 ENCOUNTER — Other Ambulatory Visit: Payer: Self-pay | Admitting: Cardiovascular Disease

## 2021-04-15 ENCOUNTER — Other Ambulatory Visit: Payer: Self-pay | Admitting: Family Medicine

## 2021-04-15 DIAGNOSIS — Z1231 Encounter for screening mammogram for malignant neoplasm of breast: Secondary | ICD-10-CM

## 2021-06-05 ENCOUNTER — Ambulatory Visit: Payer: BC Managed Care – PPO

## 2021-06-07 ENCOUNTER — Ambulatory Visit: Payer: Medicaid Other

## 2021-06-09 ENCOUNTER — Other Ambulatory Visit: Payer: Self-pay

## 2021-06-09 ENCOUNTER — Ambulatory Visit
Admission: RE | Admit: 2021-06-09 | Discharge: 2021-06-09 | Disposition: A | Discharge status: Home / Self Care | Payer: Medicaid Other | Source: Ambulatory Visit | Attending: Family Medicine | Admitting: Family Medicine

## 2021-06-09 DIAGNOSIS — Z1231 Encounter for screening mammogram for malignant neoplasm of breast: Secondary | ICD-10-CM

## 2022-01-11 ENCOUNTER — Emergency Department (HOSPITAL_BASED_OUTPATIENT_CLINIC_OR_DEPARTMENT_OTHER)
Admission: EM | Admit: 2022-01-11 | Discharge: 2022-01-11 | Disposition: A | Discharge status: Home / Self Care | Payer: Medicaid Other | Attending: Emergency Medicine | Admitting: Emergency Medicine

## 2022-01-11 ENCOUNTER — Emergency Department (HOSPITAL_BASED_OUTPATIENT_CLINIC_OR_DEPARTMENT_OTHER): Payer: Medicaid Other

## 2022-01-11 ENCOUNTER — Other Ambulatory Visit: Payer: Self-pay

## 2022-01-11 ENCOUNTER — Encounter (HOSPITAL_BASED_OUTPATIENT_CLINIC_OR_DEPARTMENT_OTHER): Payer: Self-pay | Admitting: Emergency Medicine

## 2022-01-11 DIAGNOSIS — A084 Viral intestinal infection, unspecified: Secondary | ICD-10-CM | POA: Diagnosis not present

## 2022-01-11 DIAGNOSIS — Z91040 Latex allergy status: Secondary | ICD-10-CM | POA: Insufficient documentation

## 2022-01-11 DIAGNOSIS — B9689 Other specified bacterial agents as the cause of diseases classified elsewhere: Secondary | ICD-10-CM | POA: Insufficient documentation

## 2022-01-11 DIAGNOSIS — R102 Pelvic and perineal pain: Secondary | ICD-10-CM

## 2022-01-11 DIAGNOSIS — R1084 Generalized abdominal pain: Secondary | ICD-10-CM | POA: Diagnosis present

## 2022-01-11 DIAGNOSIS — N760 Acute vaginitis: Secondary | ICD-10-CM | POA: Diagnosis not present

## 2022-01-11 LAB — WET PREP, GENITAL
Sperm: NONE SEEN
Trich, Wet Prep: NONE SEEN
WBC, Wet Prep HPF POC: <10 (ref <10)
Yeast Wet Prep HPF POC: NONE SEEN

## 2022-01-11 LAB — CBC WITH DIFFERENTIAL/PLATELET
Abs Immature Granulocytes: 0.03 10*3/uL (ref 0.00–0.07)
Basophils Absolute: 0.1 10*3/uL (ref 0.0–0.1)
Basophils Relative: 1 %
Eosinophils Absolute: 0.7 10*3/uL — ABNORMAL HIGH (ref 0.0–0.5)
Eosinophils Relative: 8 %
HCT: 36 % (ref 36.0–46.0)
Hemoglobin: 12.3 g/dL (ref 12.0–15.0)
Immature Granulocytes: 0 %
Lymphocytes Relative: 29 %
Lymphs Abs: 2.5 10*3/uL (ref 0.7–4.0)
MCH: 30.6 pg (ref 26.0–34.0)
MCHC: 34.2 g/dL (ref 30.0–36.0)
MCV: 89.6 fL (ref 80.0–100.0)
Monocytes Absolute: 0.9 10*3/uL (ref 0.1–1.0)
Monocytes Relative: 10 %
Neutro Abs: 4.6 10*3/uL (ref 1.7–7.7)
Neutrophils Relative %: 52 %
Platelets: 182 10*3/uL (ref 150–400)
RBC: 4.02 MIL/uL (ref 3.87–5.11)
RDW: 13.5 % (ref 11.5–15.5)
WBC: 8.8 10*3/uL (ref 4.0–10.5)
nRBC: 0 % (ref 0.0–0.2)

## 2022-01-11 LAB — COMPREHENSIVE METABOLIC PANEL
ALT: 19 U/L (ref 0–44)
AST: 23 U/L (ref 15–41)
Albumin: 4 g/dL (ref 3.5–5.0)
Alkaline Phosphatase: 44 U/L (ref 38–126)
Anion gap: 7 (ref 5–15)
BUN: 10 mg/dL (ref 6–20)
CO2: 27 mmol/L (ref 22–32)
Calcium: 9 mg/dL (ref 8.9–10.3)
Chloride: 104 mmol/L (ref 98–111)
Creatinine, Ser: 0.68 mg/dL (ref 0.44–1.00)
GFR, Estimated: >60 mL/min (ref >60)
Glucose, Bld: 82 mg/dL (ref 70–99)
Potassium: 3.4 mmol/L — ABNORMAL LOW (ref 3.5–5.1)
Sodium: 138 mmol/L (ref 135–145)
Total Bilirubin: 0.4 mg/dL (ref 0.3–1.2)
Total Protein: 7.3 g/dL (ref 6.5–8.1)

## 2022-01-11 LAB — URINALYSIS, ROUTINE W REFLEX MICROSCOPIC
Bilirubin Urine: NEGATIVE
Glucose, UA: NEGATIVE mg/dL
Ketones, ur: NEGATIVE mg/dL
Leukocytes,Ua: NEGATIVE
Nitrite: NEGATIVE
Protein, ur: NEGATIVE mg/dL
Specific Gravity, Urine: 1.02 (ref 1.005–1.030)
pH: 7 (ref 5.0–8.0)

## 2022-01-11 LAB — URINALYSIS, MICROSCOPIC (REFLEX)

## 2022-01-11 LAB — LIPASE, BLOOD: Lipase: 26 U/L (ref 11–51)

## 2022-01-11 MED ORDER — IOHEXOL 300 MG/ML  SOLN
100.0000 mL | Freq: Once | INTRAMUSCULAR | Status: AC | PRN
  Administered 2022-01-11: 100 mL via INTRAVENOUS

## 2022-01-11 MED ORDER — MORPHINE SULFATE (PF) 4 MG/ML IV SOLN
4.0000 mg | Freq: Once | INTRAVENOUS | Status: AC
  Administered 2022-01-11: 4 mg via INTRAVENOUS
  Filled 2022-01-11: qty 1

## 2022-01-11 MED ORDER — DICYCLOMINE HCL 10 MG PO CAPS
10.0000 mg | ORAL_CAPSULE | Freq: Once | ORAL | Status: AC
  Administered 2022-01-11: 10 mg via ORAL
  Filled 2022-01-11: qty 1

## 2022-01-11 MED ORDER — FLUCONAZOLE 150 MG PO TABS
ORAL_TABLET | ORAL | 0 refills | Status: DC
Start: 2022-01-11 — End: 2023-05-10

## 2022-01-11 MED ORDER — ONDANSETRON 4 MG PO TBDP: 4.0000 mg | ORAL_TABLET | Freq: Three times a day (TID) | ORAL | 0 refills | Status: DC | PRN

## 2022-01-11 MED ORDER — DICYCLOMINE HCL 20 MG PO TABS: 20.0000 mg | ORAL_TABLET | Freq: Two times a day (BID) | ORAL | 0 refills | Status: DC

## 2022-01-11 MED ORDER — METRONIDAZOLE 500 MG PO TABS: 500.0000 mg | ORAL_TABLET | Freq: Two times a day (BID) | ORAL | 0 refills | Status: DC

## 2022-01-11 MED ORDER — ONDANSETRON HCL 4 MG/2ML IJ SOLN
4.0000 mg | Freq: Once | INTRAMUSCULAR | Status: AC
  Administered 2022-01-11: 4 mg via INTRAVENOUS
  Filled 2022-01-11: qty 2

## 2022-01-11 MED ORDER — SODIUM CHLORIDE 0.9 % IV BOLUS
1000.0000 mL | Freq: Once | INTRAVENOUS | Status: AC
  Administered 2022-01-11: 1000 mL via INTRAVENOUS

## 2022-01-11 NOTE — Discharge Instructions (Signed)
Please pick up medications and take as prescribed  Follow up with  your PCP next week for further evaluation of your symptoms  We have sent off gonorrhea and chlamydia testing and will call you in 2-3 days time IF you test positive  Return to the ED for any new/worsening symptoms

## 2022-01-11 NOTE — ED Provider Notes (Signed)
[facility] EMERGENCY DEPARTMENT Provider Note   CSN: [id number] Arrival date & time: 01/11/22  1354     History  Chief Complaint  Patient presents with   Back Pain    Janice Boyd is a 46 y.o. female who presents to the ED today with complaint of gradual onset, constant, worsening, diffuse abdominal pain that began 2 days ago. Pt reports the pain is worse in the epigastric/suprapubic/right flank area. She also complains of nausea, nbnb emesis, and diarrhea.  Patient denies any specific urinary complaints.  She states that she went to urgent care today who tested her for COVID and flu which was negative.  They did advise that she come to the ED for further evaluation however.  Patient denies any recent sick contacts.  She denies any recent antibiotic use.  She denies suspicious food intake.  Past surgical history does include C-section and tubal ligation.  The history is provided by the patient and medical records.      Home Medications Prior to Admission medications   Medication Sig Start Date End Date Taking? Authorizing Provider  dicyclomine (BENTYL) 20 MG tablet Take 1 tablet (20 mg total) by mouth 2 (two) times daily. 01/11/22  Yes Avien Taha, PA-C  metroNIDAZOLE (FLAGYL) 500 MG tablet Take 1 tablet (500 mg total) by mouth 2 (two) times daily. 01/11/22  Yes Euell Schiff, PA-C  ondansetron (ZOFRAN-ODT) 4 MG disintegrating tablet Take 1 tablet (4 mg total) by mouth every 8 (eight) hours as needed for nausea or vomiting. 01/11/22  Yes Alroy Bailiff, Carianne Taira, PA-C  Calcium Carbonate (CALTRATE 600 PO) Take 600 mg by mouth daily.     [provider]  cetirizine (ZYRTEC) 10 MG tablet Take 10 mg by mouth daily. 01/09/21   [provider]  cyclobenzaprine (FLEXERIL) 10 MG tablet Take 10 mg by mouth 3 (three) times daily as needed. 09/12/20   [provider]  ibuprofen (ADVIL) 800 MG tablet Take 1 tablet (800 mg total) by mouth 3 (three) times daily.  01/28/21   O'NealCassie Freer, MD  Multiple Vitamins-Minerals (MULTIVITAMIN PO) Take 1 tablet by mouth 3 (three) times a week.    [provider]      Allergies    Augmentin [amoxicillin-pot clavulanate], Doxycycline, Amoxicillin, Cyclobenzaprine, and Latex    Review of Systems   Review of Systems  Constitutional:  Negative for chills and fever.  Gastrointestinal:  Positive for abdominal pain, diarrhea, nausea and vomiting.  Genitourinary:  Positive for flank pain. Negative for dysuria, frequency and pelvic pain.  All other systems reviewed and are negative.  Physical Exam Updated Vital Signs BP 128/88    Pulse 70    Temp 97.7 F (36.5 C) (Oral)    Resp 15    Ht 5\' 7"  (1.702 m)    Wt 87.1 kg    LMP 12/30/2021    SpO2 100%    BMI 30.07 kg/m  Physical Exam Vitals and nursing note reviewed.  Constitutional:      Appearance: She is not ill-appearing or diaphoretic.  HENT:     Head: Normocephalic and atraumatic.     Mouth/Throat:     Mouth: Mucous membranes are dry.  Eyes:     Conjunctiva/sclera: Conjunctivae normal.  Cardiovascular:     Rate and Rhythm: Normal rate and regular rhythm.     Pulses: Normal pulses.  Pulmonary:     Effort: Pulmonary effort is normal.     Breath sounds: Normal breath sounds. No wheezing, rhonchi  or rales.  Abdominal:     Palpations: Abdomen is soft.     Tenderness: There is abdominal tenderness. There is right CVA tenderness. There is no guarding or rebound.  Genitourinary:    Comments: Chaperone present for exam. No rashes, lesions, or tenderness to external genitalia. No erythema, injury, or tenderness to vaginal mucosa. Mild vaginal discharge in vault. + Right adnexal TTP. No CMT, cervical friability, or discharge from cervical os. Cervical os is closed. Uterus non-deviated, mobile, nonTTP, and without enlargement.  Musculoskeletal:     Cervical back: Neck supple.  Skin:    General: Skin is warm and dry.  Neurological:     Mental  Status: She is alert.    ED Results / Procedures / Treatments   Labs (all labs ordered are listed, but only abnormal results are displayed) Labs Reviewed  WET PREP, GENITAL - Abnormal; Notable for the following components:      Result Value   Clue Cells Wet Prep HPF POC PRESENT (*)    All other components within normal limits  URINALYSIS, ROUTINE W REFLEX MICROSCOPIC - Abnormal; Notable for the following components:   Hgb urine dipstick TRACE (*)    All other components within normal limits  COMPREHENSIVE METABOLIC PANEL - Abnormal; Notable for the following components:   Potassium 3.4 (*)    All other components within normal limits  CBC WITH DIFFERENTIAL/PLATELET - Abnormal; Notable for the following components:   Eosinophils Absolute 0.7 (*)    All other components within normal limits  URINALYSIS, MICROSCOPIC (REFLEX) - Abnormal; Notable for the following components:   Bacteria, UA FEW (*)    All other components within normal limits  LIPASE, BLOOD  GC/CHLAMYDIA PROBE AMP ([place]) NOT AT [facility]    EKG None  Radiology CT Abdomen Pelvis W Contrast  Result Date: 01/11/2022 CLINICAL DATA:  46 year old female with acute abdominal and pelvic pain for 3 days. EXAM: CT ABDOMEN AND PELVIS WITH CONTRAST TECHNIQUE: Multidetector CT imaging of the abdomen and pelvis was performed using the standard protocol following bolus administration of intravenous contrast. RADIATION DOSE REDUCTION: This exam was performed according to the departmental dose-optimization program which includes automated exposure control, adjustment of the mA and/or kV according to patient size and/or use of iterative reconstruction technique. CONTRAST:  112mL OMNIPAQUE IOHEXOL 300 MG/ML  SOLN COMPARISON:  09/11/2008 CT FINDINGS: Lower chest: No acute abnormality Hepatobiliary: The liver and gallbladder are unremarkable. No biliary dilatation. Pancreas: Unremarkable Spleen: Unremarkable Adrenals/Urinary Tract: The  kidneys, adrenal glands and bladder are unremarkable. Stomach/Bowel: Stomach is within normal limits. Appendix appears normal. No evidence of bowel wall thickening, distention, or inflammatory changes. Vascular/Lymphatic: Aortic atherosclerosis. No enlarged abdominal or pelvic lymph nodes. Reproductive: Uterus and bilateral adnexa are unremarkable. Other: No ascites, focal collection or pneumoperitoneum. Musculoskeletal: No acute or suspicious bony abnormalities are noted. IMPRESSION: 1. No evidence of acute abnormality. 2. Aortic Atherosclerosis (ICD10-I70.0). Electronically Signed   By: Margarette Canada M.D.   On: 01/11/2022 16:19   US PELVIC COMPLETE W TRANSVAGINAL AND TORSION R/O  Result Date: 01/11/2022 CLINICAL DATA:  Pelvic pain EXAM: TRANSABDOMINAL AND TRANSVAGINAL ULTRASOUND OF PELVIS DOPPLER ULTRASOUND OF OVARIES TECHNIQUE: Both transabdominal and transvaginal ultrasound examinations of the pelvis were performed. Transabdominal technique was performed for global imaging of the pelvis including uterus, ovaries, adnexal regions, and pelvic cul-de-sac. It was necessary to proceed with endovaginal exam following the transabdominal exam to visualize the uterus, endometrium, ovaries and adnexa. Color and duplex Doppler ultrasound was utilized  to evaluate blood flow to the ovaries. COMPARISON:  06/28/2018 FINDINGS: Uterus Measurements: 10.9 x 5.0 x 5.8 cm = volume: 167 mL. No fibroids or other mass visualized. Endometrium Thickness: Normal thickness, 8 mm.  No focal abnormality visualized. Right ovary Measurements: 3.1 x 2.3 x 2.8 cm = volume: 10.8 mL. 2.4 cm dominant follicle. No adnexal mass. Left ovary Measurements: 3.5 x 1.9 x 2.3 cm = volume: 8 mL. Small follicles. No adnexal mass. Pulsed Doppler evaluation of both ovaries demonstrates normal low-resistance arterial and venous waveforms. Other findings No abnormal free fluid. IMPRESSION: No ovarian abnormality or evidence of torsion. No acute findings.  Electronically Signed   By: Rolm Baptise M.D.   On: 01/11/2022 17:39    Procedures Procedures    Medications Ordered in ED Medications  sodium chloride 0.9 % bolus 1,000 mL (0 mLs Intravenous Stopped 01/11/22 1640)  ondansetron (ZOFRAN) injection 4 mg (4 mg Intravenous Given 01/11/22 1512)  morphine 4 MG/ML injection 4 mg (4 mg Intravenous Given 01/11/22 1514)  iohexol (OMNIPAQUE) 300 MG/ML solution 100 mL (100 mLs Intravenous Contrast Given 01/11/22 1602)  dicyclomine (BENTYL) capsule 10 mg (10 mg Oral Given 01/11/22 1746)    ED Course/ Medical Decision Making/ A&P Clinical Course as of 01/11/22 1810  Sun Jan 11, 2022  1658 Clue Cells Wet Prep HPF POC(!): PRESENT [MV]    Clinical Course User Index [MV] Eustaquio Maize, PA-C                           Medical Decision Making 46 year old female presents to the ED today with complaint of diffuse abdominal pain for the past 2 days with associated nausea, sinus, diarrhea.  Evaluated at urgent care this morning and tested negative for COVID and flu and advised to come to the ED for further evaluation.  On arrival to the vitals are stable.  Patient does appear slightly uncomfortable on exam.  She has diffuse abdominal tenderness palpation however reports that it is worse in the epigastric, suprapubic, right flank.  She denies specific urinary symptoms.  Urinalysis has been collected and is pending.  We will add on CBC, CMP, lipase at this time.  History tubal ligation; no preg test ordered. Will provide antiemetics, fluids, pain medication and reevaluate.  Question if she could be experiencing pain related to UTI, pyelonephritis, kidney stone versus possibility for gallbladder etiology or other acute intra-abdominal infection.  She denies any recent antibiotic use to suggest C. difficile.  Given she tested negative for COVID and flu today I do not feel she requires repeat of this.  Past history of tubal ligation.  She denies any specific pelvic pain or  vaginal discharge at this time.   Labs and CT scan without acute findings. Pt mentions to me she has had similar back pain with BV. Pelvic exam performed. Wet prep positive for clue cells. Right adnexal TTP on exam; pelvic ultrasound ordered.   Problems Addressed: Bacterial vaginosis: acute illness or injury    Details: Wet prep positive for clue cells. Discharged with flagyl. Pelvic pain: acute illness or injury    Details: Likely s/2 to BV. No findings on ultrasound to suggest torsion or other abnormality. Viral gastroenteritis: acute illness or injury    Details: Workup overall reassuring in the ED. Pt successfully fluid challenged. Will discharge home with zofran and bentyl. Pt instructed on bland diet and PCP follow up.  Amount and/or Complexity of Data Reviewed Labs: ordered. Decision-making  details documented in ED Course.    Details: CBC without leukocytosis. Hgb stable at 12.3 CMP with potassium 3.4. No other electrolyte abnormalities. LFTs unremarkable.  Lipase WNL at 26 U/A with trace hgb on dipstick however 0-5 rbcs per HPF. Few bacteria. No leuks or nitrites. Does not seem consistent with UTI at this time. Radiology: ordered.    Details: CT: IMPRESSION:  1. No evidence of acute abnormality.  2. Aortic Atherosclerosis (ICD10-I70.0).  Ultrasound: IMPRESSION:  No ovarian abnormality or evidence of torsion.   No acute findings. ECG/medicine tests: ordered.  Risk Prescription drug management.           Final Clinical Impression(s) / ED Diagnoses Final diagnoses:  Viral gastroenteritis  Bacterial vaginosis    Rx / DC Orders ED Discharge Orders          Ordered    ondansetron (ZOFRAN-ODT) 4 MG disintegrating tablet  Every 8 hours PRN        01/11/22 1748    dicyclomine (BENTYL) 20 MG tablet  2 times daily        01/11/22 1748    metroNIDAZOLE (FLAGYL) 500 MG tablet  2 times daily        01/11/22 1748             Discharge Instructions       Please pick up medications and take as prescribed  Follow up with  your PCP next week for further evaluation of your symptoms  We have sent off gonorrhea and chlamydia testing and will call you in 2-3 days time IF you test positive  Return to the ED for any new/worsening symptoms         Eustaquio Maize, PA-C 01/11/22 1810    Fredia Sorrow, MD 01/18/22 [phone]

## 2022-01-11 NOTE — ED Notes (Signed)
Pt. Describes pain being in her low back and cervical; however, she is ambiguous about symptoms onset, quality and duration. "Hurts all over" sometimes.  No swelling or abnormal musculoskeletal findings.

## 2022-01-11 NOTE — ED Triage Notes (Signed)
Pt arrives pov with c/o lower back and lower abdominal pain x 3 days. Was treated at [facility], sent to ED. Pt endorses shob, dysuria.

## 2022-01-11 NOTE — ED Notes (Signed)
Pelvic cart at bedside. 

## 2022-01-11 NOTE — ED Notes (Signed)
Pt given gingerale for PO challenge 

## 2022-01-12 LAB — GC/CHLAMYDIA PROBE AMP (~~LOC~~) NOT AT ARMC
Chlamydia: NEGATIVE
Comment: NEGATIVE
Comment: NORMAL
Neisseria Gonorrhea: NEGATIVE

## 2022-01-29 IMAGING — CT CT ABD-PELV W/ CM
2 of 5 series · 17 of 46 positions shown, 19 images · IV contrast (Omnipaque)
Comparison: 09/11/2008 CT

CLINICAL DATA: 45-year-old female with acute abdominal and pelvic
pain for 3 days.

EXAM:
CT ABDOMEN AND PELVIS WITH CONTRAST
TECHNIQUE: Multidetector CT imaging of the abdomen and pelvis was performed
using the standard protocol following bolus administration of
intravenous contrast.

[Series 2: axial st · axial · 0.70mm/px · z∈[-392,-32]mm · 14 of 81 slices shown, 16 images]
[im 5/81  soft-tissue]
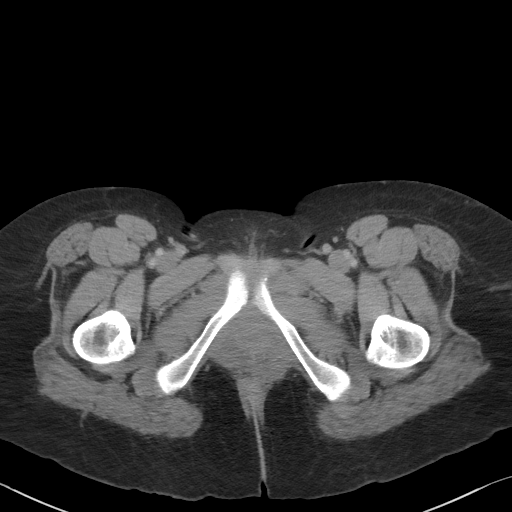
[im 5/81  bone]
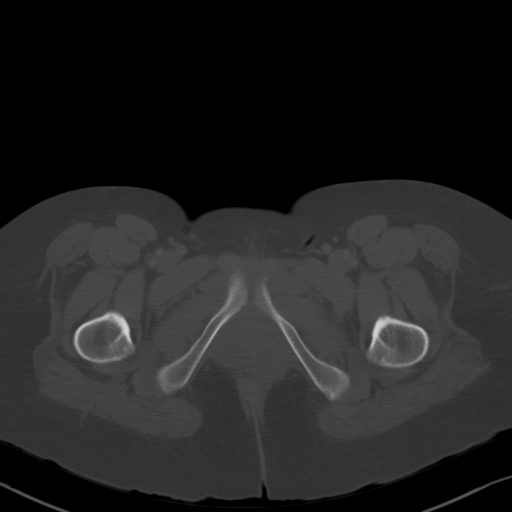
[im 13/81  soft-tissue]
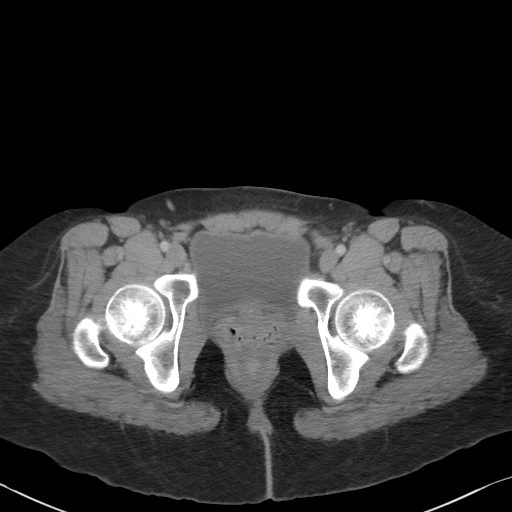
[im 17/81  soft-tissue]
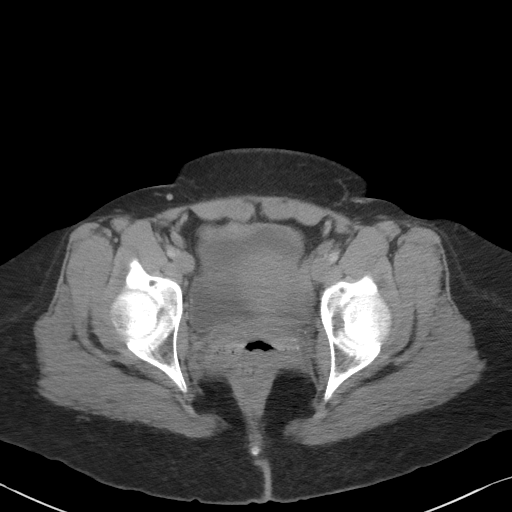
[im 21/81  soft-tissue]
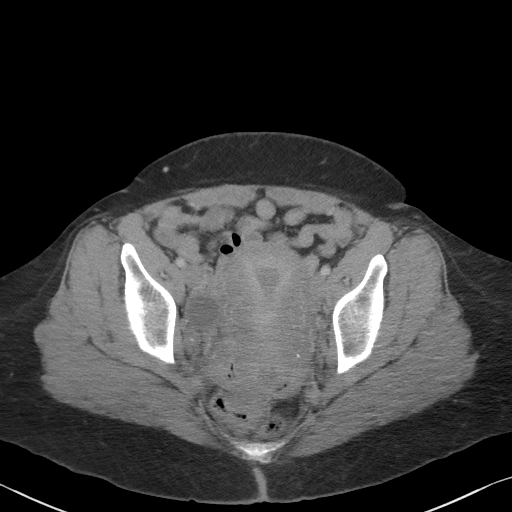
[im 29/81  soft-tissue]
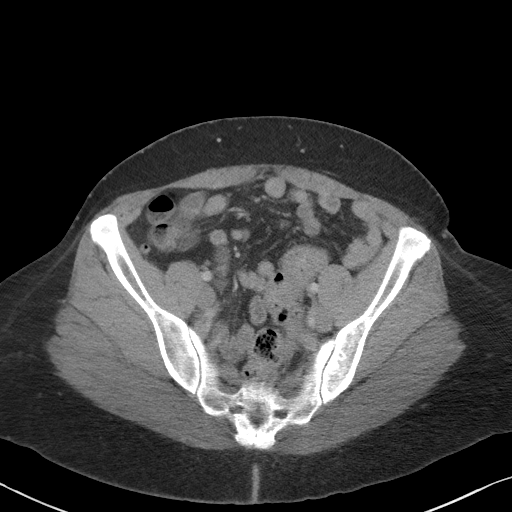
[im 33/81  soft-tissue]
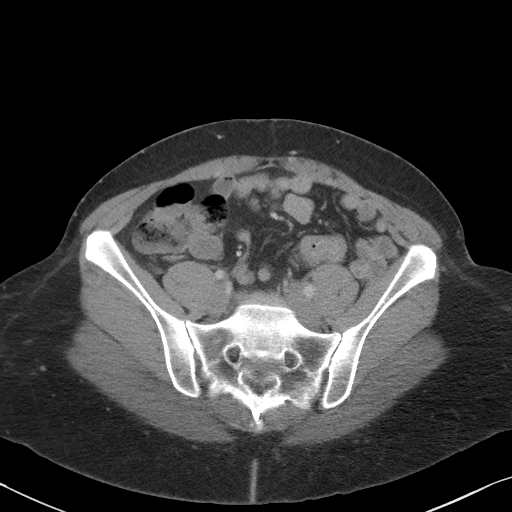
[im 37/81  soft-tissue]
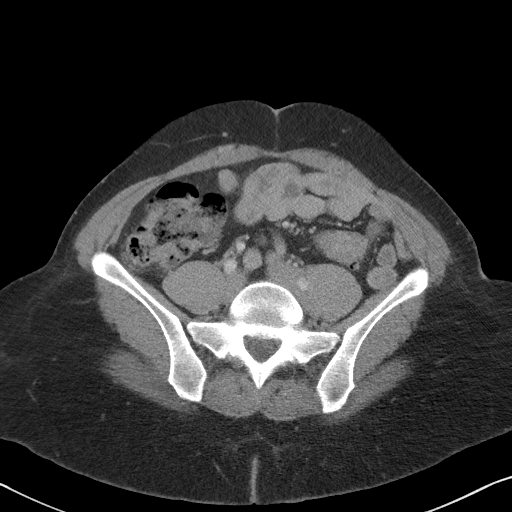
[im 45/81  soft-tissue]
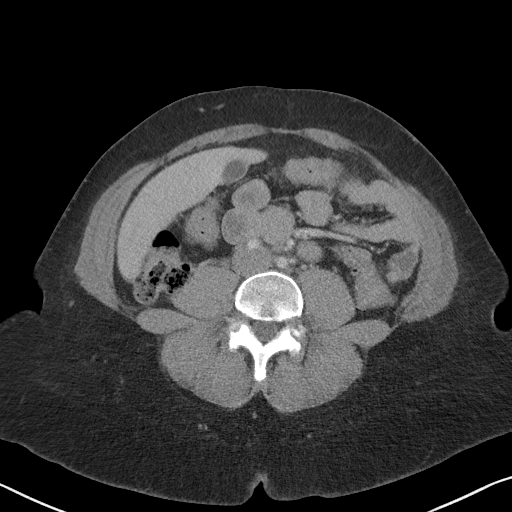
[im 49/81  soft-tissue]
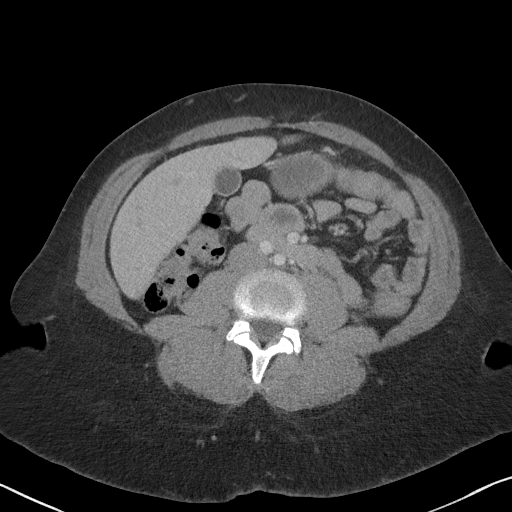
[im 49/81  bone]
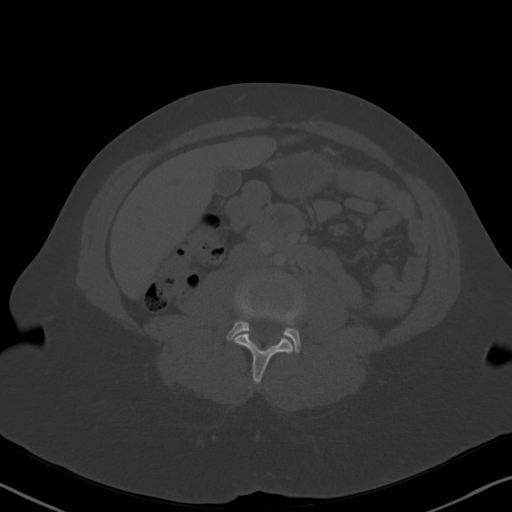
[im 53/81  soft-tissue]
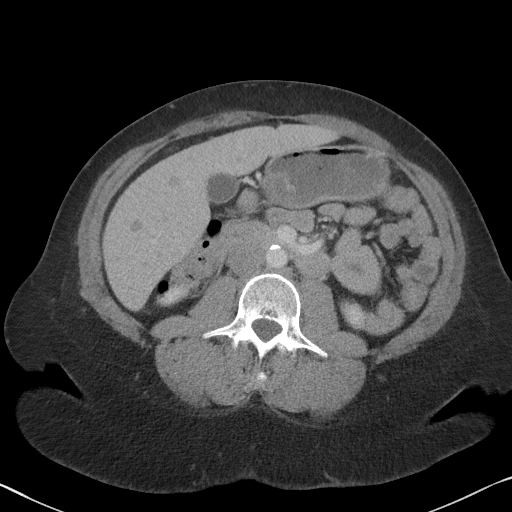
[im 61/81  soft-tissue]
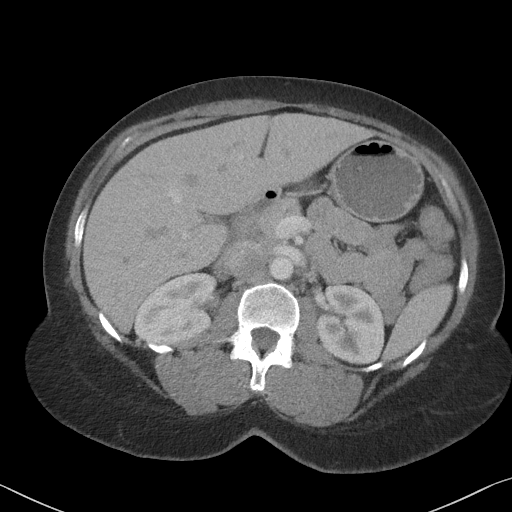
[im 65/81  soft-tissue]
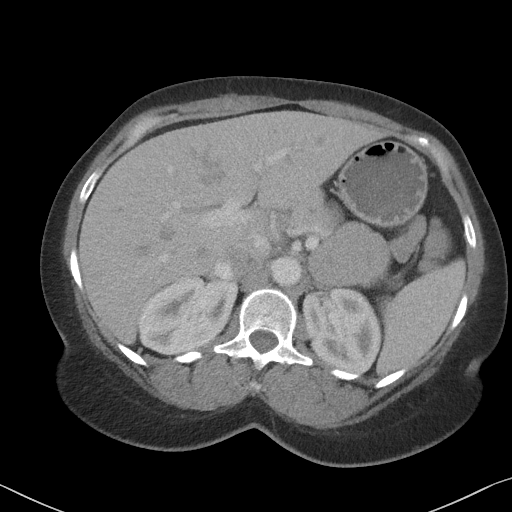
[im 69/81  soft-tissue]
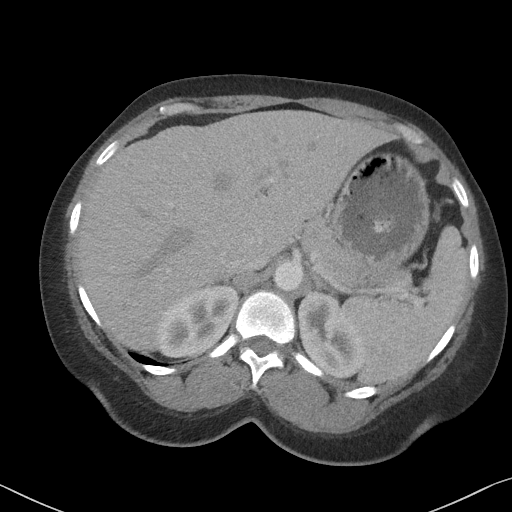
[im 77/81  soft-tissue]
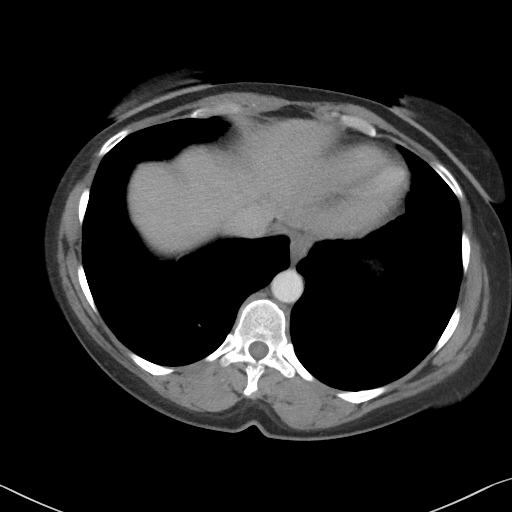

[Series 5: coronal st · coronal · 0.79mm/px · 3 of 94 slices shown]
[im 32/94  soft-tissue]
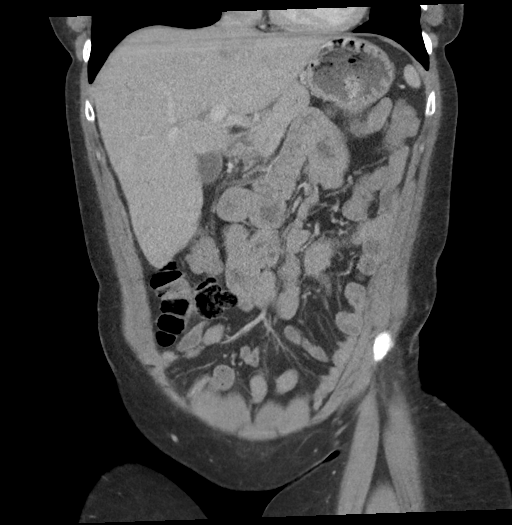
[im 42/94  soft-tissue]
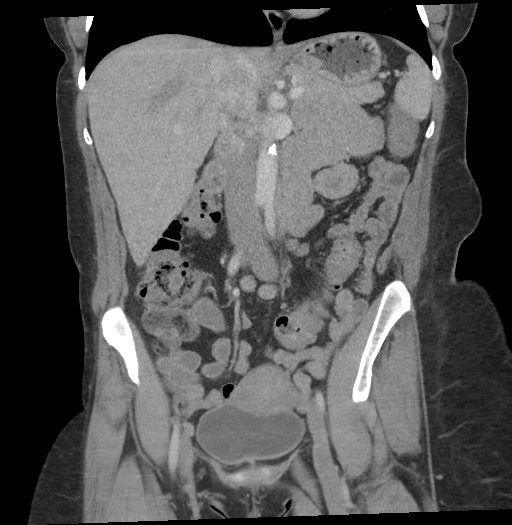
[im 52/94  soft-tissue]
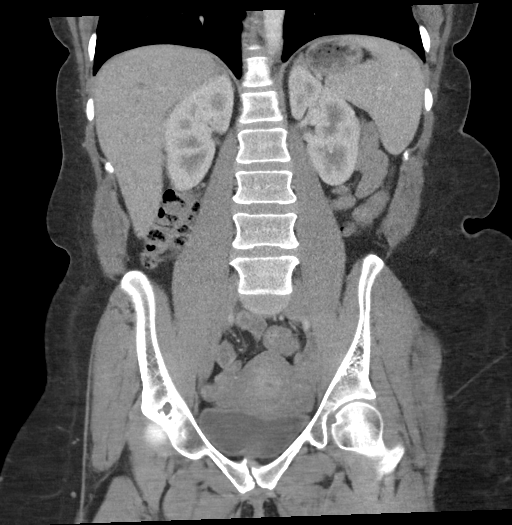

[17 of 46 positions shown; findings below may reference images not displayed]

RADIATION DOSE REDUCTION: This exam was performed according to the
departmental dose-optimization program which includes automated
exposure control, adjustment of the mA and/or kV according to
patient size and/or use of iterative reconstruction technique.

CONTRAST:  100mL OMNIPAQUE IOHEXOL 300 MG/ML  SOLN
FINDINGS: Lower chest: No acute abnormality

Hepatobiliary: The liver and gallbladder are unremarkable. No
biliary dilatation.

Pancreas: Unremarkable

Spleen: Unremarkable

Adrenals/Urinary Tract: The kidneys, adrenal glands and bladder are
unremarkable.

Stomach/Bowel: Stomach is within normal limits. Appendix appears
normal. No evidence of bowel wall thickening, distention, or
inflammatory changes.

Vascular/Lymphatic: Aortic atherosclerosis. No enlarged abdominal or
pelvic lymph nodes.

Reproductive: Uterus and bilateral adnexa are unremarkable.

Other: No ascites, focal collection or pneumoperitoneum.

Musculoskeletal: No acute or suspicious bony abnormalities are
noted.
IMPRESSION: 1. No evidence of acute abnormality.
2. Aortic Atherosclerosis ([id number]).

## 2022-01-29 IMAGING — US US PELVIS COMPLETE TRANSABD/TRANSVAG W DUPLEX AND/OR DOPPLER
1 series · 13 of 25 positions shown · non-contrast
Comparison: 06/28/2018

CLINICAL DATA: Pelvic pain

EXAM:
TRANSABDOMINAL AND TRANSVAGINAL ULTRASOUND OF PELVIS
DOPPLER ULTRASOUND OF OVARIES
TECHNIQUE: Both transabdominal and transvaginal ultrasound examinations of the
pelvis were performed. Transabdominal technique was performed for
global imaging of the pelvis including uterus, ovaries, adnexal
regions, and pelvic cul-de-sac.
It was necessary to proceed with endovaginal exam following the
transabdominal exam to visualize the uterus, endometrium, ovaries
and adnexa. Color and duplex Doppler ultrasound was utilized to
evaluate blood flow to the ovaries.

[Series 1: us pelvis complete transabd/transvag w duplex and/ · 13 of 115 slices shown]
[im 1/115]
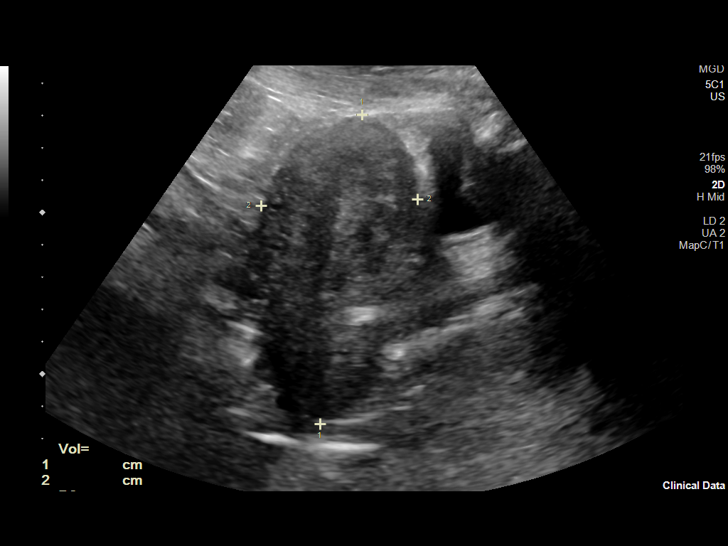
[im 10/115]
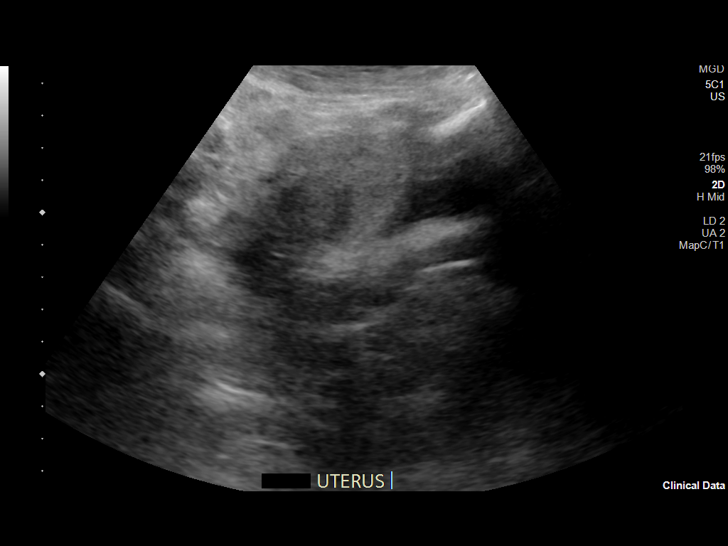
[im 20/115]
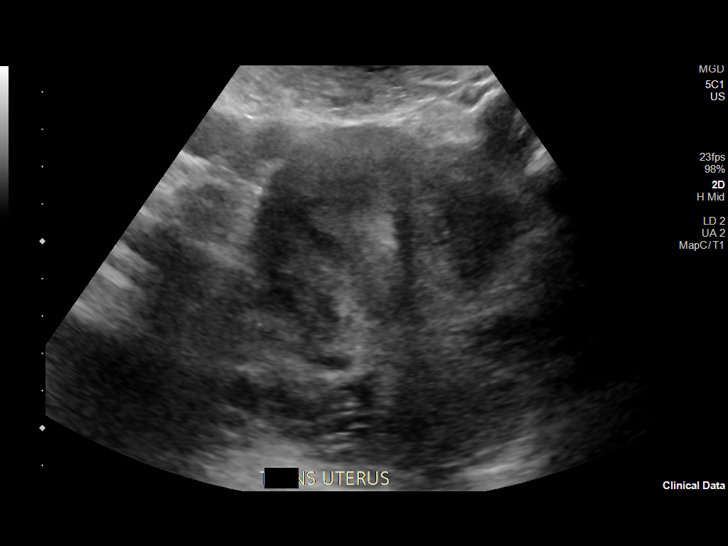
[im 29/115]
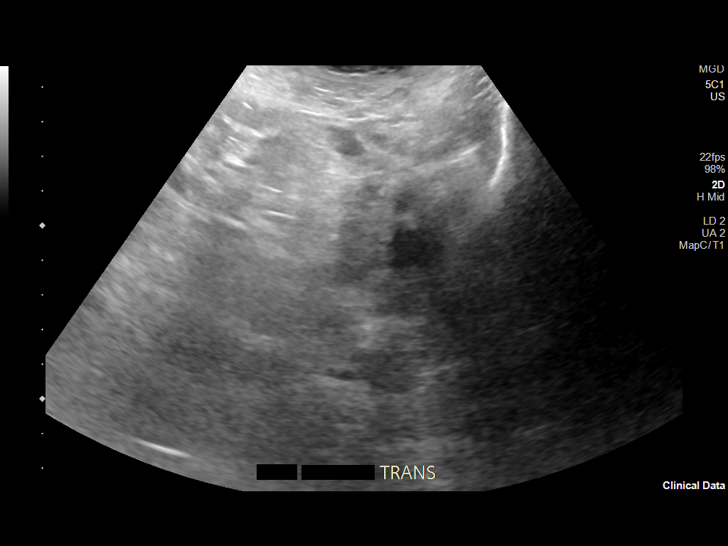
[im 39/115]
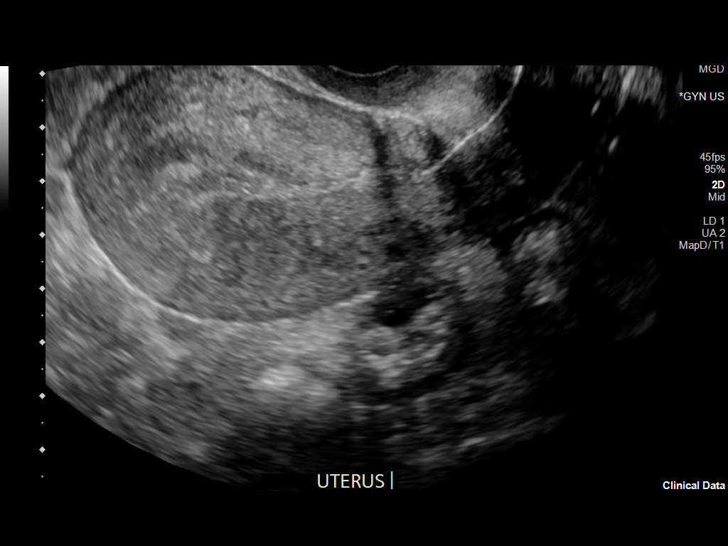
[im 48/115]
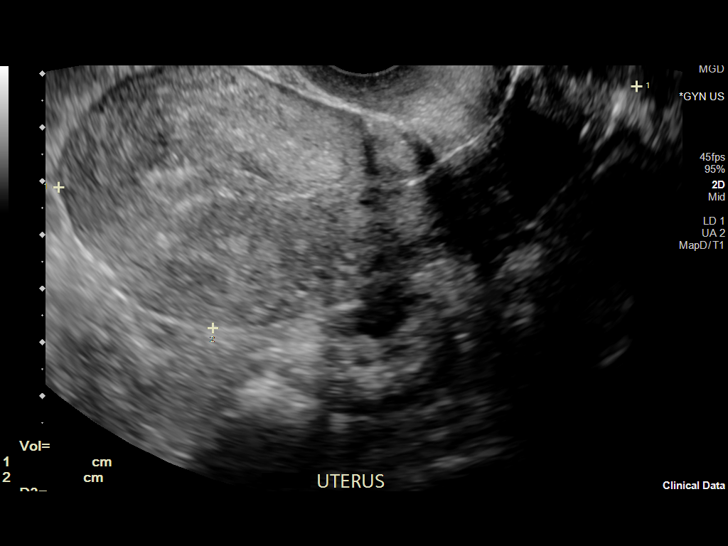
[im 58/115]
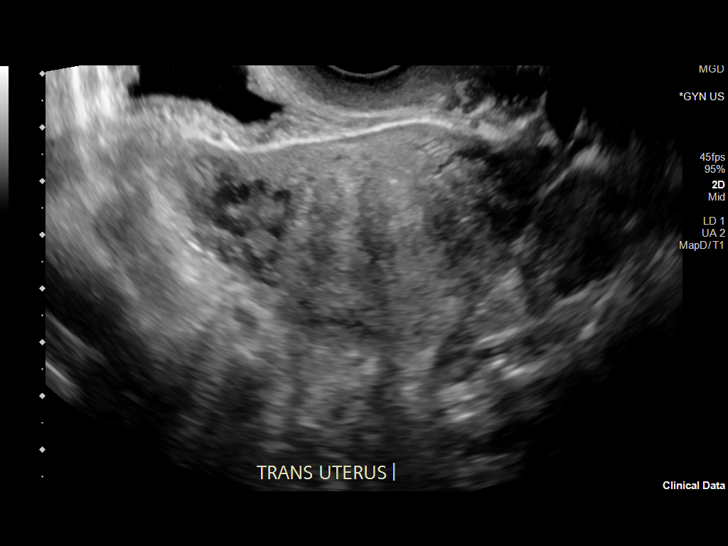
[im 67/115]
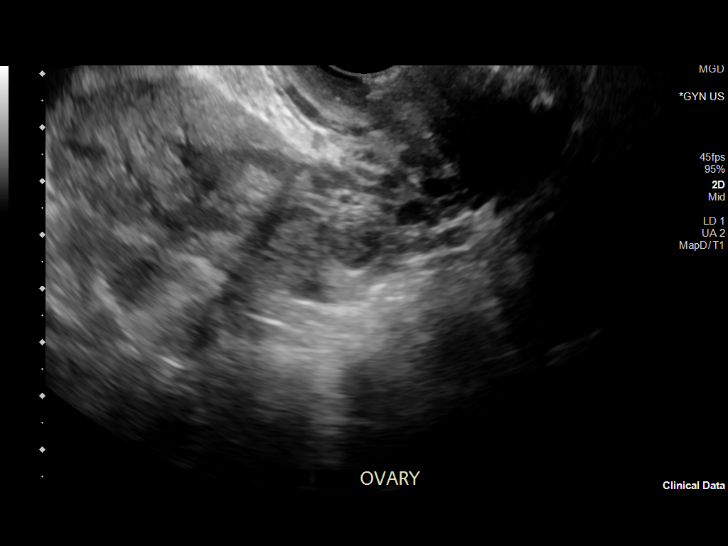
[im 77/115]
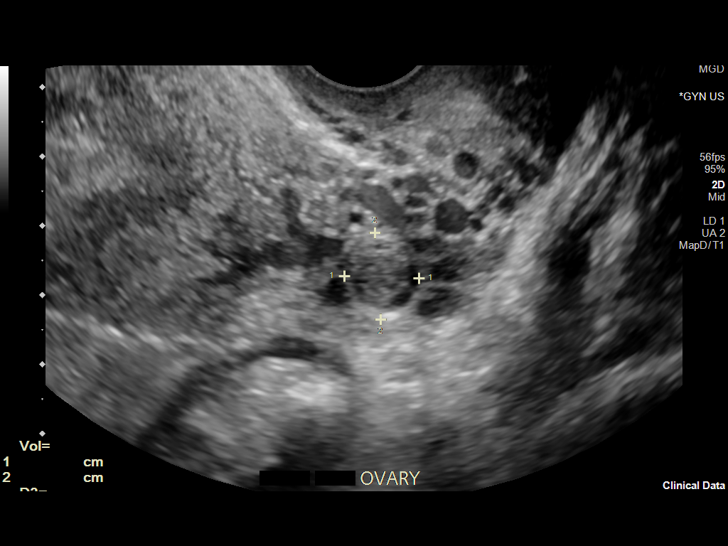
[im 86/115]
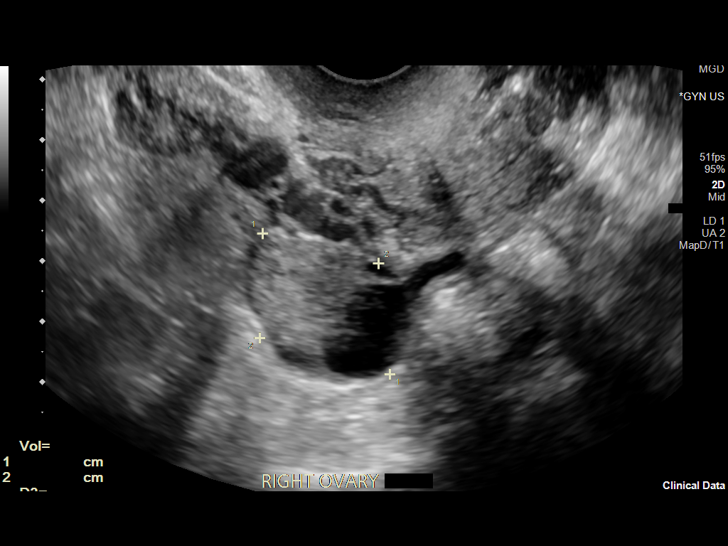
[im 96/115]
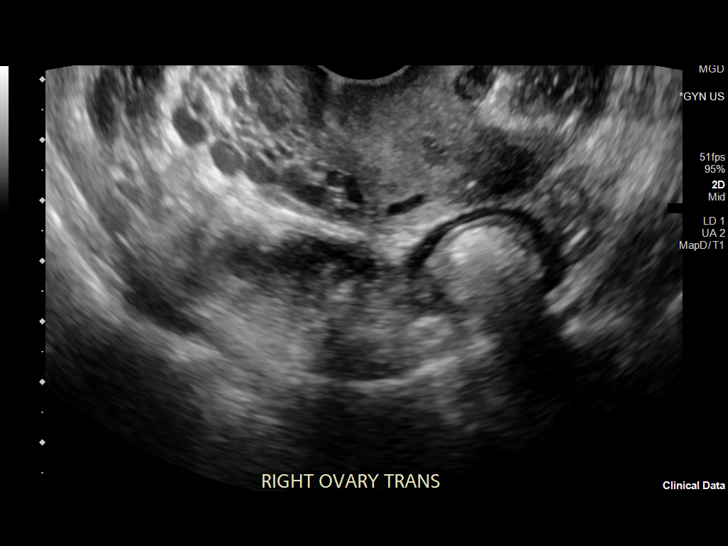
[im 105/115]
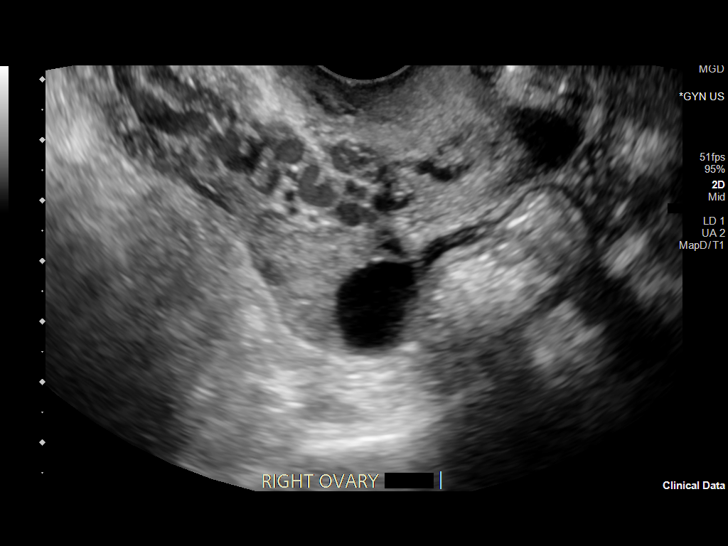
[im 115/115]
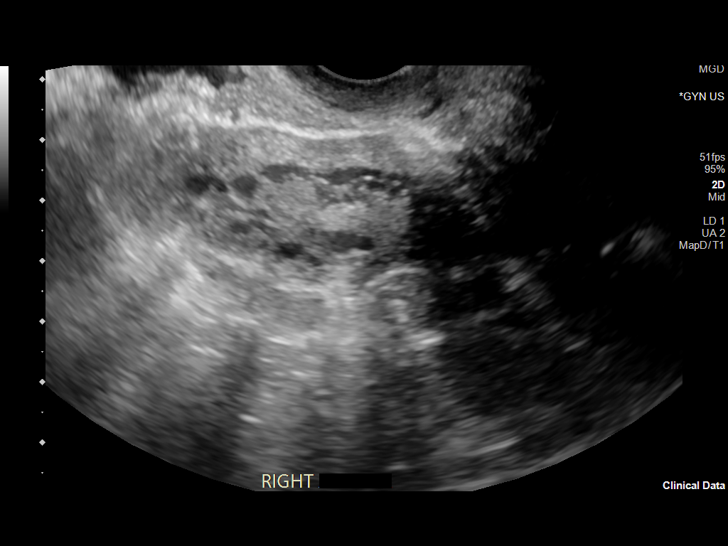

[13 of 25 positions shown; findings below may reference images not displayed]

FINDINGS: Uterus

Measurements: 10.9 x 5.0 x 5.8 cm = volume: 167 mL. No fibroids or
other mass visualized.

Endometrium

Thickness: Normal thickness, 8 mm.  No focal abnormality visualized.

Right ovary

Measurements: 3.1 x 2.3 x 2.8 cm = volume: 10.8 mL. 2.4 cm dominant
follicle. No adnexal mass.

Left ovary

Measurements: 3.5 x 1.9 x 2.3 cm = volume: 8 mL. Small follicles. No
adnexal mass.

Pulsed Doppler evaluation of both ovaries demonstrates normal
low-resistance arterial and venous waveforms.

Other findings

No abnormal free fluid.
IMPRESSION: No ovarian abnormality or evidence of torsion.

No acute findings.

## 2022-04-20 HISTORY — PX: CARPAL TUNNEL RELEASE: SHX101

## 2022-06-05 ENCOUNTER — Encounter (HOSPITAL_BASED_OUTPATIENT_CLINIC_OR_DEPARTMENT_OTHER): Payer: Self-pay

## 2022-06-05 ENCOUNTER — Emergency Department (HOSPITAL_BASED_OUTPATIENT_CLINIC_OR_DEPARTMENT_OTHER): Payer: Medicaid Other

## 2022-06-05 ENCOUNTER — Emergency Department (HOSPITAL_BASED_OUTPATIENT_CLINIC_OR_DEPARTMENT_OTHER)
Admission: EM | Admit: 2022-06-05 | Discharge: 2022-06-05 | Disposition: A | Discharge status: Home / Self Care | Payer: Medicaid Other | Attending: Emergency Medicine | Admitting: Emergency Medicine

## 2022-06-05 ENCOUNTER — Other Ambulatory Visit: Payer: Self-pay

## 2022-06-05 DIAGNOSIS — S0990XA Unspecified injury of head, initial encounter: Secondary | ICD-10-CM | POA: Insufficient documentation

## 2022-06-05 DIAGNOSIS — Z23 Encounter for immunization: Secondary | ICD-10-CM | POA: Insufficient documentation

## 2022-06-05 DIAGNOSIS — M549 Dorsalgia, unspecified: Secondary | ICD-10-CM | POA: Diagnosis not present

## 2022-06-05 DIAGNOSIS — S60221A Contusion of right hand, initial encounter: Secondary | ICD-10-CM | POA: Diagnosis not present

## 2022-06-05 DIAGNOSIS — S299XXA Unspecified injury of thorax, initial encounter: Secondary | ICD-10-CM | POA: Diagnosis not present

## 2022-06-05 DIAGNOSIS — Y92009 Unspecified place in unspecified non-institutional (private) residence as the place of occurrence of the external cause: Secondary | ICD-10-CM | POA: Diagnosis not present

## 2022-06-05 DIAGNOSIS — S5011XA Contusion of right forearm, initial encounter: Secondary | ICD-10-CM | POA: Insufficient documentation

## 2022-06-05 DIAGNOSIS — Z91040 Latex allergy status: Secondary | ICD-10-CM | POA: Insufficient documentation

## 2022-06-05 DIAGNOSIS — M542 Cervicalgia: Secondary | ICD-10-CM | POA: Insufficient documentation

## 2022-06-05 DIAGNOSIS — S301XXA Contusion of abdominal wall, initial encounter: Secondary | ICD-10-CM | POA: Insufficient documentation

## 2022-06-05 DIAGNOSIS — S5012XA Contusion of left forearm, initial encounter: Secondary | ICD-10-CM | POA: Insufficient documentation

## 2022-06-05 DIAGNOSIS — S60222A Contusion of left hand, initial encounter: Secondary | ICD-10-CM | POA: Insufficient documentation

## 2022-06-05 DIAGNOSIS — S3991XA Unspecified injury of abdomen, initial encounter: Secondary | ICD-10-CM | POA: Diagnosis present

## 2022-06-05 LAB — CBC WITH DIFFERENTIAL/PLATELET
Abs Immature Granulocytes: 0.03 10*3/uL (ref 0.00–0.07)
Basophils Absolute: 0 10*3/uL (ref 0.0–0.1)
Basophils Relative: 0 %
Eosinophils Absolute: 0 10*3/uL (ref 0.0–0.5)
Eosinophils Relative: 0 %
HCT: 41.5 % (ref 36.0–46.0)
Hemoglobin: 14.4 g/dL (ref 12.0–15.0)
Immature Granulocytes: 0 %
Lymphocytes Relative: 22 %
Lymphs Abs: 2.5 10*3/uL (ref 0.7–4.0)
MCH: 31.4 pg (ref 26.0–34.0)
MCHC: 34.7 g/dL (ref 30.0–36.0)
MCV: 90.6 fL (ref 80.0–100.0)
Monocytes Absolute: 0.9 10*3/uL (ref 0.1–1.0)
Monocytes Relative: 8 %
Neutro Abs: 7.9 10*3/uL — ABNORMAL HIGH (ref 1.7–7.7)
Neutrophils Relative %: 70 %
Platelets: 134 10*3/uL — ABNORMAL LOW (ref 150–400)
RBC: 4.58 MIL/uL (ref 3.87–5.11)
RDW: 13.4 % (ref 11.5–15.5)
WBC: 11.4 10*3/uL — ABNORMAL HIGH (ref 4.0–10.5)
nRBC: 0 % (ref 0.0–0.2)

## 2022-06-05 LAB — BASIC METABOLIC PANEL
Anion gap: 9 (ref 5–15)
BUN: 13 mg/dL (ref 6–20)
CO2: 21 mmol/L — ABNORMAL LOW (ref 22–32)
Calcium: 9.3 mg/dL (ref 8.9–10.3)
Chloride: 107 mmol/L (ref 98–111)
Creatinine, Ser: 0.93 mg/dL (ref 0.44–1.00)
GFR, Estimated: >60 mL/min (ref >60)
Glucose, Bld: 78 mg/dL (ref 70–99)
Potassium: 4 mmol/L (ref 3.5–5.1)
Sodium: 137 mmol/L (ref 135–145)

## 2022-06-05 LAB — PREGNANCY, URINE: Preg Test, Ur: NEGATIVE

## 2022-06-05 MED ORDER — NAPROXEN 500 MG PO TABS: 500.0000 mg | ORAL_TABLET | Freq: Two times a day (BID) | ORAL | 0 refills | Status: DC

## 2022-06-05 MED ORDER — TETANUS-DIPHTH-ACELL PERTUSSIS 5-2.5-18.5 LF-MCG/0.5 IM SUSY
0.5000 mL | PREFILLED_SYRINGE | Freq: Once | INTRAMUSCULAR | Status: AC
  Administered 2022-06-05: 0.5 mL via INTRAMUSCULAR
  Filled 2022-06-05: qty 0.5

## 2022-06-05 MED ORDER — ONDANSETRON HCL 4 MG/2ML IJ SOLN
4.0000 mg | Freq: Once | INTRAMUSCULAR | Status: AC
  Administered 2022-06-05: 4 mg via INTRAVENOUS
  Filled 2022-06-05: qty 2

## 2022-06-05 MED ORDER — IOHEXOL 300 MG/ML  SOLN
100.0000 mL | Freq: Once | INTRAMUSCULAR | Status: AC | PRN
  Administered 2022-06-05: 100 mL via INTRAVENOUS

## 2022-06-05 MED ORDER — OXYCODONE HCL 5 MG PO TABS: 5.0000 mg | ORAL_TABLET | ORAL | 0 refills | Status: DC | PRN

## 2022-06-05 MED ORDER — TETANUS-DIPHTH-ACELL PERTUSSIS 5-2.5-18.5 LF-MCG/0.5 IM SUSY: 0.5000 mL | PREFILLED_SYRINGE | Freq: Once | INTRAMUSCULAR | Status: DC

## 2022-06-05 MED ORDER — MORPHINE SULFATE (PF) 4 MG/ML IV SOLN
4.0000 mg | Freq: Once | INTRAVENOUS | Status: AC
  Administered 2022-06-05: 4 mg via INTRAVENOUS
  Filled 2022-06-05: qty 1

## 2022-06-05 NOTE — ED Notes (Signed)
Off Duty LE[employer] to pt bedside

## 2022-06-05 NOTE — ED Notes (Signed)
Pt provided snack and drink per request

## 2022-06-05 NOTE — Discharge Instructions (Addendum)
Follow-up outpatient. ° °Return for new or worsening symptoms. °
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
## 2022-06-05 NOTE — ED Notes (Signed)
Patient transported to CT 

## 2022-07-10 ENCOUNTER — Other Ambulatory Visit: Payer: Self-pay | Admitting: Family Medicine

## 2022-07-10 DIAGNOSIS — Z1231 Encounter for screening mammogram for malignant neoplasm of breast: Secondary | ICD-10-CM

## 2022-07-14 ENCOUNTER — Other Ambulatory Visit: Payer: Self-pay | Admitting: Family Medicine

## 2022-07-14 DIAGNOSIS — N644 Mastodynia: Secondary | ICD-10-CM

## 2022-07-23 ENCOUNTER — Ambulatory Visit
Admission: RE | Admit: 2022-07-23 | Discharge: 2022-07-23 | Disposition: A | Discharge status: Home / Self Care | Payer: Medicaid Other | Source: Ambulatory Visit | Attending: Family Medicine | Admitting: Family Medicine

## 2022-07-23 ENCOUNTER — Ambulatory Visit: Payer: Medicaid Other

## 2022-07-23 DIAGNOSIS — N644 Mastodynia: Secondary | ICD-10-CM

## 2023-04-13 ENCOUNTER — Other Ambulatory Visit: Payer: Self-pay

## 2023-04-13 ENCOUNTER — Encounter (HOSPITAL_BASED_OUTPATIENT_CLINIC_OR_DEPARTMENT_OTHER): Payer: Self-pay

## 2023-04-13 ENCOUNTER — Emergency Department (HOSPITAL_BASED_OUTPATIENT_CLINIC_OR_DEPARTMENT_OTHER)
Admission: EM | Admit: 2023-04-13 | Discharge: 2023-04-13 | Disposition: A | Discharge status: Home / Self Care | Payer: Medicaid Other | Attending: Emergency Medicine | Admitting: Emergency Medicine

## 2023-04-13 DIAGNOSIS — F17210 Nicotine dependence, cigarettes, uncomplicated: Secondary | ICD-10-CM | POA: Diagnosis not present

## 2023-04-13 DIAGNOSIS — L249 Irritant contact dermatitis, unspecified cause: Secondary | ICD-10-CM | POA: Diagnosis present

## 2023-04-13 MED ORDER — DIPHENHYDRAMINE HCL 50 MG/ML IJ SOLN
25.0000 mg | Freq: Once | INTRAMUSCULAR | Status: AC
  Administered 2023-04-13: 25 mg via INTRAVENOUS
  Filled 2023-04-13: qty 1

## 2023-04-13 MED ORDER — FAMOTIDINE IN NACL 20-0.9 MG/50ML-% IV SOLN
20.0000 mg | Freq: Once | INTRAVENOUS | Status: AC
  Administered 2023-04-13: 20 mg via INTRAVENOUS
  Filled 2023-04-13: qty 50

## 2023-04-13 MED ORDER — DIPHENHYDRAMINE HCL 25 MG PO TABS: 25.0000 mg | ORAL_TABLET | Freq: Four times a day (QID) | ORAL | 0 refills | Status: DC

## 2023-04-13 MED ORDER — HYDROCORTISONE 2.5 % EX LOTN: TOPICAL_LOTION | Freq: Two times a day (BID) | CUTANEOUS | 0 refills | Status: DC

## 2023-04-13 MED ORDER — METHYLPREDNISOLONE SODIUM SUCC 125 MG IJ SOLR
125.0000 mg | Freq: Once | INTRAMUSCULAR | Status: AC
  Administered 2023-04-13: 125 mg via INTRAVENOUS
  Filled 2023-04-13: qty 2
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
## 2023-04-13 NOTE — Discharge Instructions (Signed)
You were evaluated in the Emergency Department and after careful evaluation, we did not find any emergent condition requiring admission or further testing in the hospital.  Your exam/testing today was overall reassuring.  Use Benadryl as needed for itching or rash, use the hydrocortisone cream as well twice daily.  Should improve over the next few days.  Please return to the Emergency Department if you experience any worsening of your condition.  Thank you for allowing [place] to be a part of your care.

## 2023-04-13 NOTE — ED Triage Notes (Signed)
Pt was at work using work gloves and now has burning, itching, swelling and redness - onset 2000.  Pt denies SOB or difficulty breathing.

## 2023-04-15 ENCOUNTER — Emergency Department (HOSPITAL_BASED_OUTPATIENT_CLINIC_OR_DEPARTMENT_OTHER)
Admission: EM | Admit: 2023-04-15 | Discharge: 2023-04-15 | Disposition: A | Discharge status: Home / Self Care | Payer: Medicaid Other | Attending: Emergency Medicine | Admitting: Emergency Medicine

## 2023-04-15 ENCOUNTER — Encounter (HOSPITAL_BASED_OUTPATIENT_CLINIC_OR_DEPARTMENT_OTHER): Payer: Self-pay | Admitting: Pediatrics

## 2023-04-15 ENCOUNTER — Other Ambulatory Visit: Payer: Self-pay

## 2023-04-15 DIAGNOSIS — R002 Palpitations: Secondary | ICD-10-CM | POA: Insufficient documentation

## 2023-04-15 DIAGNOSIS — L299 Pruritus, unspecified: Secondary | ICD-10-CM | POA: Insufficient documentation

## 2023-04-15 DIAGNOSIS — R112 Nausea with vomiting, unspecified: Secondary | ICD-10-CM

## 2023-04-15 LAB — COMPREHENSIVE METABOLIC PANEL
ALT: 16 U/L (ref 0–44)
AST: 19 U/L (ref 15–41)
Albumin: 4.3 g/dL (ref 3.5–5.0)
Alkaline Phosphatase: 45 U/L (ref 38–126)
Anion gap: 9 (ref 5–15)
BUN: 11 mg/dL (ref 6–20)
CO2: 24 mmol/L (ref 22–32)
Calcium: 9 mg/dL (ref 8.9–10.3)
Chloride: 103 mmol/L (ref 98–111)
Creatinine, Ser: 0.72 mg/dL (ref 0.44–1.00)
GFR, Estimated: >60 mL/min (ref >60)
Glucose, Bld: 87 mg/dL (ref 70–99)
Potassium: 3.9 mmol/L (ref 3.5–5.1)
Sodium: 136 mmol/L (ref 135–145)
Total Bilirubin: 0.4 mg/dL (ref 0.3–1.2)
Total Protein: 7.9 g/dL (ref 6.5–8.1)

## 2023-04-15 LAB — URINALYSIS, ROUTINE W REFLEX MICROSCOPIC
Bilirubin Urine: NEGATIVE
Glucose, UA: NEGATIVE mg/dL
Ketones, ur: NEGATIVE mg/dL
Leukocytes,Ua: NEGATIVE
Nitrite: NEGATIVE
Protein, ur: NEGATIVE mg/dL
Specific Gravity, Urine: 1.015 (ref 1.005–1.030)
pH: 7 (ref 5.0–8.0)

## 2023-04-15 LAB — CBC WITH DIFFERENTIAL/PLATELET
Abs Immature Granulocytes: 0.02 10*3/uL (ref 0.00–0.07)
Basophils Absolute: 0 10*3/uL (ref 0.0–0.1)
Basophils Relative: 0 %
Eosinophils Absolute: 0.8 10*3/uL — ABNORMAL HIGH (ref 0.0–0.5)
Eosinophils Relative: 11 %
HCT: 38.3 % (ref 36.0–46.0)
Hemoglobin: 13.3 g/dL (ref 12.0–15.0)
Immature Granulocytes: 0 %
Lymphocytes Relative: 26 %
Lymphs Abs: 1.9 10*3/uL (ref 0.7–4.0)
MCH: 31.2 pg (ref 26.0–34.0)
MCHC: 34.7 g/dL (ref 30.0–36.0)
MCV: 89.9 fL (ref 80.0–100.0)
Monocytes Absolute: 0.6 10*3/uL (ref 0.1–1.0)
Monocytes Relative: 8 %
Neutro Abs: 4 10*3/uL (ref 1.7–7.7)
Neutrophils Relative %: 55 %
Platelets: 182 10*3/uL (ref 150–400)
RBC: 4.26 MIL/uL (ref 3.87–5.11)
RDW: 13.7 % (ref 11.5–15.5)
WBC: 7.3 10*3/uL (ref 4.0–10.5)
nRBC: 0 % (ref 0.0–0.2)

## 2023-04-15 LAB — URINALYSIS, MICROSCOPIC (REFLEX): WBC, UA: NONE SEEN WBC/hpf (ref 0–5)

## 2023-04-15 LAB — TROPONIN I (HIGH SENSITIVITY): Troponin I (High Sensitivity): <2 ng/L (ref <18)

## 2023-04-15 LAB — LIPASE, BLOOD: Lipase: 23 U/L (ref 11–51)

## 2023-04-15 MED ORDER — PREDNISONE 20 MG PO TABS
40.0000 mg | ORAL_TABLET | Freq: Once | ORAL | Status: AC
  Administered 2023-04-15: 40 mg via ORAL
  Filled 2023-04-15: qty 2

## 2023-04-15 MED ORDER — ONDANSETRON 4 MG PO TBDP: 4.0000 mg | ORAL_TABLET | Freq: Three times a day (TID) | ORAL | 0 refills | Status: DC | PRN

## 2023-04-15 MED ORDER — PREDNISONE 10 MG PO TABS: 20.0000 mg | ORAL_TABLET | Freq: Every day | ORAL | 0 refills | Status: AC

## 2023-04-15 MED ORDER — HYDROXYZINE HCL 25 MG PO TABS: 25.0000 mg | ORAL_TABLET | Freq: Four times a day (QID) | ORAL | 0 refills | Status: DC | PRN

## 2023-04-15 NOTE — ED Notes (Signed)
Pt states when water touches her is causes more redness,itching, and burning. States, "It feels like itching in my knees"

## 2023-04-15 NOTE — Discharge Instructions (Signed)
Please follow with your primary care doctor in the coming week.  I have called in some medicines to the pharmacy.  Please take as directed.  Return with any new or suddenly worsening symptoms.
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
## 2023-04-15 NOTE — ED Triage Notes (Signed)
C/O allergic reaction on her hands Monday night, c/o itching, throat swelling and shortness of breath. Reports benadryl and with no relief. C/O swelling, itching and burning sensation all over.
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
## 2023-05-06 ENCOUNTER — Ambulatory Visit: Payer: Medicaid Other | Admitting: Internal Medicine

## 2023-05-10 ENCOUNTER — Encounter: Payer: Self-pay | Admitting: Internal Medicine

## 2023-05-10 ENCOUNTER — Ambulatory Visit (INDEPENDENT_AMBULATORY_CARE_PROVIDER_SITE_OTHER): Payer: No Typology Code available for payment source | Admitting: Internal Medicine

## 2023-05-10 VITALS — BP 110/70 | HR 64 | Temp 98.0°F | Resp 16 | Ht 67.0 in | Wt 200.8 lb

## 2023-05-10 DIAGNOSIS — Z887 Allergy status to serum and vaccine status: Secondary | ICD-10-CM

## 2023-05-10 DIAGNOSIS — Z720 Tobacco use: Secondary | ICD-10-CM

## 2023-05-10 DIAGNOSIS — L2389 Allergic contact dermatitis due to other agents: Secondary | ICD-10-CM

## 2023-05-10 DIAGNOSIS — T8069XA Other serum reaction due to other serum, initial encounter: Secondary | ICD-10-CM

## 2023-05-10 NOTE — Patient Instructions (Addendum)
Contact Dermatitis  Patches are best placed on Monday with return to office on Wednesday and Friday of same week for readings.  Patches once placed should not get wet.  You do not have to stop any medications for patch testing but should not be on oral prednisone. You can schedule a patch testing visit when convenient for your schedule.   We will have you scheduled for patch testing after 05/20/23 (4 weeks after prednisone)   Arthritis (red, hot swollen joints) can be a reaction to an infection or other immune stimulation  We will get labs to evaluate this futher (CBC, ANA, C3/C4, ESR, CRP)   Follow up: for patch testing   Thank you so much for letting me partake in your care today.  Don't hesitate to reach out if you have any additional concerns!  Ferol Luz, MD  [facility] [place]
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
## 2023-06-07 ENCOUNTER — Ambulatory Visit: Payer: Medicaid Other | Admitting: Internal Medicine

## 2023-06-09 ENCOUNTER — Encounter: Payer: Medicaid Other | Admitting: Internal Medicine

## 2023-06-11 ENCOUNTER — Encounter: Payer: Medicaid Other | Admitting: Internal Medicine

## 2023-06-23 ENCOUNTER — Other Ambulatory Visit: Payer: Self-pay | Admitting: Family Medicine

## 2023-06-23 DIAGNOSIS — Z1231 Encounter for screening mammogram for malignant neoplasm of breast: Secondary | ICD-10-CM

## 2023-07-27 ENCOUNTER — Ambulatory Visit: Payer: Medicaid Other

## 2023-07-28 ENCOUNTER — Ambulatory Visit
Admission: RE | Admit: 2023-07-28 | Discharge: 2023-07-28 | Disposition: A | Discharge status: Home / Self Care | Payer: Medicaid Other | Source: Ambulatory Visit | Attending: Family Medicine | Admitting: Family Medicine

## 2023-07-28 ENCOUNTER — Ambulatory Visit: Payer: Medicaid Other

## 2023-07-28 DIAGNOSIS — Z1231 Encounter for screening mammogram for malignant neoplasm of breast: Secondary | ICD-10-CM

## 2023-09-11 ENCOUNTER — Emergency Department (HOSPITAL_BASED_OUTPATIENT_CLINIC_OR_DEPARTMENT_OTHER): Payer: Medicaid Other

## 2023-09-11 ENCOUNTER — Encounter (HOSPITAL_BASED_OUTPATIENT_CLINIC_OR_DEPARTMENT_OTHER): Payer: Self-pay

## 2023-09-11 ENCOUNTER — Emergency Department (HOSPITAL_BASED_OUTPATIENT_CLINIC_OR_DEPARTMENT_OTHER)
Admission: EM | Admit: 2023-09-11 | Discharge: 2023-09-11 | Disposition: A | Discharge status: Home / Self Care | Payer: Medicaid Other | Attending: Emergency Medicine | Admitting: Emergency Medicine

## 2023-09-11 ENCOUNTER — Other Ambulatory Visit: Payer: Self-pay

## 2023-09-11 DIAGNOSIS — S51811A Laceration without foreign body of right forearm, initial encounter: Secondary | ICD-10-CM | POA: Insufficient documentation

## 2023-09-11 DIAGNOSIS — S61411A Laceration without foreign body of right hand, initial encounter: Secondary | ICD-10-CM | POA: Diagnosis present

## 2023-09-11 DIAGNOSIS — S61011A Laceration without foreign body of right thumb without damage to nail, initial encounter: Secondary | ICD-10-CM | POA: Insufficient documentation

## 2023-09-11 DIAGNOSIS — Z91040 Latex allergy status: Secondary | ICD-10-CM | POA: Insufficient documentation

## 2023-09-11 DIAGNOSIS — W25XXXA Contact with sharp glass, initial encounter: Secondary | ICD-10-CM | POA: Diagnosis not present

## 2023-09-11 DIAGNOSIS — Z23 Encounter for immunization: Secondary | ICD-10-CM | POA: Insufficient documentation

## 2023-09-11 MED ORDER — ACETAMINOPHEN 325 MG PO TABS: 650.0000 mg | ORAL_TABLET | Freq: Once | ORAL | Status: DC

## 2023-09-11 MED ORDER — LIDOCAINE HCL (PF) 1 % IJ SOLN
30.0000 mL | Freq: Once | INTRAMUSCULAR | Status: AC
  Administered 2023-09-11: 30 mL
  Filled 2023-09-11: qty 30

## 2023-09-11 MED ORDER — TETANUS-DIPHTH-ACELL PERTUSSIS 5-2.5-18.5 LF-MCG/0.5 IM SUSY
0.5000 mL | PREFILLED_SYRINGE | Freq: Once | INTRAMUSCULAR | Status: AC
  Administered 2023-09-11: 0.5 mL via INTRAMUSCULAR
  Filled 2023-09-11: qty 0.5

## 2023-09-11 MED ORDER — BACITRACIN ZINC 500 UNIT/GM EX OINT
TOPICAL_OINTMENT | Freq: Two times a day (BID) | CUTANEOUS | Status: DC
  Filled 2023-09-11: qty 28.35

## 2023-09-11 NOTE — Discharge Instructions (Signed)
Please follow-up with your primary care provider regarding recent ER visit.  Today you had 3 lacerations that were repaired.  You have a total of 7 sutures placed they will need to be removed in 10 to 14 days.  Please keep the areas clean and dry.  You may keep it covered and use Neosporin ointment daily.  Your tetanus was also updated today.  May take Tylenol every 6 hours needed for pain.  If symptoms change or worsen please return to ER.

## 2023-09-11 NOTE — ED Notes (Signed)
Pt offered tylenol for pain and refused. Stated she will take her own ibuprofen for pain. Pt at bedside with family. While in the room I explain to pt why tdap injection was ordered. I spoke with provider and she received vaccine 5 years ago. I explained to the patient its advised to repeat t dap injection if an injury occurred within 5-10 years. Pt also educated by provider on injections.

## 2023-09-11 NOTE — ED Provider Notes (Signed)
[facility] [facility] Provider Note   CSN: [id number] Arrival date & time: 09/11/23  1114     History  Chief Complaint  Patient presents with   Hand Injury    Janice Boyd is a 47 y.o. female with no pertinent past medical history presented after cutting her right hand on glass.  Patient states she was trying to work on stained-glass and a glass broke cutting her right hand and forearm.  Patient is unsure of foreign bodies.  Patient still move her hand and feel her fingers.  Patient denies blood thinners or bleeding disorders.  Patient states that her last tetanus was 5 years ago.  Home Medications Prior to Admission medications   Medication Sig Start Date End Date Taking? Authorizing Provider  Calcium Carbonate (CALTRATE 600 PO) Take 600 mg by mouth daily.     [provider]  cetirizine (ZYRTEC) 10 MG tablet Take 10 mg by mouth daily. 01/09/21   [provider]  ibuprofen (ADVIL) 800 MG tablet Take 1 tablet (800 mg total) by mouth 3 (three) times daily. 01/28/21   O'NealRonnald Ramp, MD  Multiple Vitamins-Minerals (MULTIVITAMIN PO) Take 1 tablet by mouth 3 (three) times a week.    [provider]      Allergies    Augmentin [amoxicillin-pot clavulanate], Doxycycline, and Latex    Review of Systems   Review of Systems  Physical Exam Updated Vital Signs BP (!) 139/98 (BP Location: Right Arm)   Pulse 79   Temp 98.2 F (36.8 C) (Oral)   Resp 18   Ht 5\' 7"  (1.702 m)   Wt 99.8 kg   SpO2 100%   BMI 34.46 kg/m  Physical Exam Constitutional:      General: She is not in acute distress. Cardiovascular:     Rate and Rhythm: Normal rate.     Pulses: Normal pulses.  Musculoskeletal:        General: Normal range of motion.     Comments: 5 out of 5 right wrist extension wrist flexion 5 out of 5 right grip strength Soft compartments Pain not out of proportion  Skin:    General: Skin is warm and dry.      Capillary Refill: Capillary refill takes less than 2 seconds.     Comments: 3 cm straight laceration noted to the distal forearm on the ulnar aspect that is approximately 1 mm deep 4 cm laceration noted in between right thumb and index finger that forms a C is approximately 2 mm deep 0.5 cm laceration noted to proximal right thumb that is approximately 1 mm deep No bony protrusions  Neurological:     Mental Status: She is alert.     Comments: Sensation intact distally  Psychiatric:        Mood and Affect: Mood normal.     ED Results / Procedures / Treatments   Labs (all labs ordered are listed, but only abnormal results are displayed) Labs Reviewed - No data to display  EKG None  Radiology DG Forearm Right  Result Date: 09/11/2023 CLINICAL DATA:  Laceration of the right hand and forearm on glass window. Evaluate for foreign body. EXAM: RIGHT FOREARM - 2 VIEW COMPARISON:  None Available. FINDINGS: Bony structures, joint spaces and soft tissues are normal. No evidence of radiopaque foreign body. No acute fracture. IMPRESSION: Normal exam. Electronically Signed   By: Elberta Fortis M.D.   On: 09/11/2023 13:44   DG Hand Complete Right  Result Date: 09/11/2023 CLINICAL DATA:  Laceration to right hand and wrist with possible foreign body/glass. EXAM: RIGHT HAND - COMPLETE 3+ VIEW COMPARISON:  06/05/2022 FINDINGS: Examination demonstrates mild degenerative changes of the fourth DIP joint without significant change. No acute fracture or dislocation. No radiopaque foreign body. IMPRESSION: 1. No acute findings.  No radiopaque foreign body. 2. Mild degenerative changes of the fourth DIP joint. Electronically Signed   By: Elberta Fortis M.D.   On: 09/11/2023 13:43    Procedures .Marland KitchenLaceration Repair  Date/Time: 09/11/2023 2:32 PM  Performed by: Netta Corrigan, PA-C Authorized by: Netta Corrigan, PA-C   Consent:    Consent obtained:  Verbal   Consent given by:  Patient   Risks, benefits,  and alternatives were discussed: yes     Risks discussed:  Infection, need for additional repair, nerve damage, pain, poor cosmetic result, poor wound healing, retained foreign body, tendon damage and vascular damage Universal protocol:    Procedure explained and questions answered to patient or proxy's satisfaction: yes     Imaging studies available: yes     Patient identity confirmed:  Verbally with patient Anesthesia:    Anesthesia method:  Local infiltration   Local anesthetic:  Lidocaine 1% w/o epi Laceration details:    Location:  Shoulder/arm   Shoulder/arm location:  R lower arm   Length (cm):  3   Depth (mm):  1 Treatment:    Area cleansed with:  Saline   Amount of cleaning:  Standard   Irrigation solution:  Sterile saline   Irrigation volume:  500 mL   Irrigation method:  Pressure wash   Visualized foreign bodies/material removed: no     Debridement:  None Skin repair:    Repair method:  Sutures   Suture size:  5-0   Suture material:  Prolene   Suture technique:  Simple interrupted   Number of sutures:  2 Approximation:    Approximation:  Close Repair type:    Repair type:  Simple Post-procedure details:    Dressing:  Antibiotic ointment, non-adherent dressing and bulky dressing   Procedure completion:  Tolerated .Marland KitchenLaceration Repair  Date/Time: 09/11/2023 2:34 PM  Performed by: Netta Corrigan, PA-C Authorized by: Netta Corrigan, PA-C   Consent:    Consent obtained:  Verbal   Consent given by:  Patient   Risks, benefits, and alternatives were discussed: yes     Risks discussed:  Infection, need for additional repair, nerve damage, pain, poor cosmetic result, poor wound healing, retained foreign body, tendon damage and vascular damage Universal protocol:    Procedure explained and questions answered to patient or proxy's satisfaction: yes     Imaging studies available: yes     Patient identity confirmed:  Verbally with patient Anesthesia:    Anesthesia  method:  Local infiltration   Local anesthetic:  Lidocaine 1% w/o epi Laceration details:    Location:  Hand   Hand location:  R palm   Length (cm):  4   Depth (mm):  2 Treatment:    Area cleansed with:  Saline   Amount of cleaning:  Standard   Irrigation solution:  Sterile saline   Irrigation volume:  500 mL   Irrigation method:  Pressure wash   Visualized foreign bodies/material removed: no     Debridement:  None Skin repair:    Repair method:  Sutures   Suture size:  5-0   Suture material:  Prolene   Suture technique:  Simple interrupted  Number of sutures:  4 Approximation:    Approximation:  Close Repair type:    Repair type:  Simple Post-procedure details:    Dressing:  Antibiotic ointment, bulky dressing and non-adherent dressing   Procedure completion:  Tolerated .Marland KitchenLaceration Repair  Date/Time: 09/11/2023 2:34 PM  Performed by: Netta Corrigan, PA-C Authorized by: Netta Corrigan, PA-C   Consent:    Consent obtained:  Verbal   Consent given by:  Patient   Risks, benefits, and alternatives were discussed: yes     Risks discussed:  Need for additional repair, nerve damage, infection, pain, poor cosmetic result, poor wound healing, retained foreign body, tendon damage and vascular damage Universal protocol:    Procedure explained and questions answered to patient or proxy's satisfaction: yes     Imaging studies available: yes     Patient identity confirmed:  Verbally with patient Anesthesia:    Anesthesia method:  Local infiltration   Local anesthetic:  Lidocaine 1% w/o epi Laceration details:    Location:  Finger   Finger location:  R thumb   Length (cm):  0.5   Depth (mm):  1 Treatment:    Area cleansed with:  Saline   Amount of cleaning:  Standard   Irrigation solution:  Sterile saline   Irrigation volume:  500 mL   Irrigation method:  Pressure wash   Visualized foreign bodies/material removed: yes     Debridement:  None Skin repair:    Repair  method:  Sutures   Suture size:  5-0   Suture material:  Prolene   Suture technique:  Simple interrupted   Number of sutures:  1 Approximation:    Approximation:  Close Repair type:    Repair type:  Simple Post-procedure details:    Dressing:  Antibiotic ointment, bulky dressing and non-adherent dressing   Procedure completion:  Tolerated     Medications Ordered in ED Medications  acetaminophen (TYLENOL) tablet 650 mg (650 mg Oral Patient Refused/Not Given 09/11/23 1333)  bacitracin ointment ( Topical Given 09/11/23 1419)  lidocaine (PF) (XYLOCAINE) 1 % injection 30 mL (30 mLs Infiltration Given 09/11/23 1415)  Tdap (BOOSTRIX) injection 0.5 mL (0.5 mLs Intramuscular Given 09/11/23 1415)    ED Course/ Medical Decision Making/ A&P                                 Medical Decision Making Amount and/or Complexity of Data Reviewed Radiology: ordered.  Risk OTC drugs. Prescription drug management.   Ascencion Dike 47 y.o. presented today for a 4 cm cm laceration to their hand, 0.5 cm laceration to proximal thumb, 3 cm laceration to right distal forearm. Working DDx that I considered at this time includes, but not limited to, FB, fracture, NV compromise, simple laceration.  R/o DDx: FB, fracture, NV compromise: These are considered less likely due to history of present illness and physical exam findings  Review of prior external notes: 04/15/2023 ED  Unique Tests and My Interpretation:  Right hand x-ray: Unremarkable Right forearm x-ray: Unremarkable  Discussion with Independent Historian:  Family member  Discussion of Management of Tests: None  Risk: Medium: prescription drug management  Risk Stratification Score: none  Plan: They are neurovascularly intact. Tetanus is out of date and will be updated. Patient is in no distress. Laceration will be repaired with standard wound care procedures and antibiotic ointment.   Patient/family educated about specific return  precautions for given chief  complaint and symptoms.  Patient/family educated about follow-up with PCP. Patient was educated to have sutures removed in Digits, palm, and sole - 10 to 14 days.  Laceration repaired without complication and with pulse, motor, sensation intact.  Patient is stable for discharge at this time. Patient/family expressed understanding of return precautions and need for follow-up. Patient spoken to regarding all imaging and laboratory results and appropriate follow up for these results. All education provided in verbal form with additional information in written form. Time was allowed for answering of patient questions. Patient discharged.          Final Clinical Impression(s) / ED Diagnoses Final diagnoses:  Laceration of right hand without foreign body, initial encounter  Laceration of right forearm, initial encounter  Laceration of right thumb without foreign body without damage to nail, initial encounter    Rx / DC Orders ED Discharge Orders     None         Netta Corrigan, PA-C 09/11/23 1439    Elayne Snare K, DO 09/11/23 1541

## 2023-09-11 NOTE — ED Triage Notes (Signed)
Pt presents present with lac to right hand and wrist. Pt reports that she was repairing a window when her had went through the glass. Bleeding controlled.

## 2023-09-12 ENCOUNTER — Emergency Department (HOSPITAL_BASED_OUTPATIENT_CLINIC_OR_DEPARTMENT_OTHER)
Admission: EM | Admit: 2023-09-12 | Discharge: 2023-09-12 | Disposition: A | Discharge status: Home / Self Care | Payer: Medicaid Other | Attending: Emergency Medicine | Admitting: Emergency Medicine

## 2023-09-12 ENCOUNTER — Encounter (HOSPITAL_BASED_OUTPATIENT_CLINIC_OR_DEPARTMENT_OTHER): Payer: Self-pay

## 2023-09-12 DIAGNOSIS — Z4800 Encounter for change or removal of nonsurgical wound dressing: Secondary | ICD-10-CM | POA: Insufficient documentation

## 2023-09-12 DIAGNOSIS — T8131XA Disruption of external operation (surgical) wound, not elsewhere classified, initial encounter: Secondary | ICD-10-CM | POA: Insufficient documentation

## 2023-09-12 DIAGNOSIS — T8130XA Disruption of wound, unspecified, initial encounter: Secondary | ICD-10-CM

## 2023-09-12 DIAGNOSIS — Z5189 Encounter for other specified aftercare: Secondary | ICD-10-CM

## 2023-09-12 DIAGNOSIS — Z91040 Latex allergy status: Secondary | ICD-10-CM | POA: Diagnosis not present

## 2023-09-12 NOTE — ED Provider Notes (Signed)
[place] [facility] Provider Note   CSN: [id number] Arrival date & time: 09/12/23  1045     History  Chief Complaint  Patient presents with   Wound Check    Janice Boyd is a 47 y.o. female who presents for evaluation of wound.  Patient was seen yesterday at 1 PM.  She suffered an injury to her dominant hand trying to work on [employer].  She suffered lacerations to her forearm and the webbing between her first and second digit.  She was noted to be up-to-date on her tetanus vaccination and had a negative x-ray of the hand without evidence of foreign body.  She was normal noted to have normal strength and sensation and underwent laceration repair between the digits on the forearm.  Patient has multiple complaints. 1)  She reports that she had bleeding from the wound all night long and that she has already had to use up multiple gauze that she was given at discharge by the provider.   2) patient reports that one of the wounds in the webbing appears to be open.  She is concerned that it was never sutured at all  She has multiple other concerns including wondering if it was normal for [place] to utilize the tables in the room as an area to perform standard suture repair and procedures.    Wound Check       Home Medications Prior to Admission medications   Medication Sig Start Date End Date Taking? Authorizing Provider  Calcium Carbonate (CALTRATE 600 PO) Take 600 mg by mouth daily.     [provider]  cetirizine (ZYRTEC) 10 MG tablet Take 10 mg by mouth daily. 01/09/21   [provider]  ibuprofen (ADVIL) 800 MG tablet Take 1 tablet (800 mg total) by mouth 3 (three) times daily. 01/28/21   O'NealRonnald Ramp, MD  Multiple Vitamins-Minerals (MULTIVITAMIN PO) Take 1 tablet by mouth 3 (three) times a week.    [provider]      Allergies    Augmentin [amoxicillin-pot clavulanate], Doxycycline, and Latex    Review  of Systems   Review of Systems  Physical Exam Updated Vital Signs BP 122/77   Pulse 79   Temp 98 F (36.7 C) (Oral)   Resp 16   Ht 5\' 7"  (1.702 m)   Wt 99.8 kg   SpO2 100%   BMI 34.46 kg/m  Physical Exam Vitals and nursing note reviewed.  Constitutional:      General: She is not in acute distress.    Appearance: She is well-developed. She is not diaphoretic.  HENT:     Head: Normocephalic and atraumatic.     Right Ear: External ear normal.     Left Ear: External ear normal.     Nose: Nose normal.     Mouth/Throat:     Mouth: Mucous membranes are moist.  Eyes:     General: No scleral icterus.    Conjunctiva/sclera: Conjunctivae normal.  Cardiovascular:     Rate and Rhythm: Normal rate and regular rhythm.     Heart sounds: Normal heart sounds. No murmur heard.    No friction rub. No gallop.  Pulmonary:     Effort: Pulmonary effort is normal. No respiratory distress.     Breath sounds: Normal breath sounds.  Abdominal:     General: Bowel sounds are normal. There is no distension.     Palpations: Abdomen is soft. There is no mass.  Tenderness: There is no abdominal tenderness. There is no guarding.  Musculoskeletal:     Cervical back: Normal range of motion.     Comments: Well healing laceration of the right forearm with 3 stitches in place.  She has a laceration of the thumb and 2 small lacerations in the webbing of the fingers.  There are stitches in place however one of the stitches appears to have pulled through the wound.  There is no active bleeding, no redness, no purulence, no sanguinous discharge.  It is tender.  It is superficial with about a millimeter of penetration below the epidermis.  Skin:    General: Skin is warm and dry.  Neurological:     Mental Status: She is alert and oriented to person, place, and time.  Psychiatric:        Behavior: Behavior normal.     ED Results / Procedures / Treatments   Labs (all labs ordered are listed, but only  abnormal results are displayed) Labs Reviewed - No data to display  EKG None  Radiology DG Forearm Right  Result Date: 09/11/2023 CLINICAL DATA:  Laceration of the right hand and forearm on glass window. Evaluate for foreign body. EXAM: RIGHT FOREARM - 2 VIEW COMPARISON:  None Available. FINDINGS: Bony structures, joint spaces and soft tissues are normal. No evidence of radiopaque foreign body. No acute fracture. IMPRESSION: Normal exam. Electronically Signed   By: Elberta Fortis M.D.   On: 09/11/2023 13:44   DG Hand Complete Right  Result Date: 09/11/2023 CLINICAL DATA:  Laceration to right hand and wrist with possible foreign body/glass. EXAM: RIGHT HAND - COMPLETE 3+ VIEW COMPARISON:  06/05/2022 FINDINGS: Examination demonstrates mild degenerative changes of the fourth DIP joint without significant change. No acute fracture or dislocation. No radiopaque foreign body. IMPRESSION: 1. No acute findings.  No radiopaque foreign body. 2. Mild degenerative changes of the fourth DIP joint. Electronically Signed   By: Elberta Fortis M.D.   On: 09/11/2023 13:43    Procedures Procedures    Medications Ordered in ED Medications - No data to display  ED Course/ Medical Decision Making/ A&P                                 Medical Decision Making  Patient here with concerns about an open wound after repair. I discussed with the patient that because she was 24 hours past the initial injury we would be unable to repair the open wound after dehiscence.  Patient asks to see a physician for a second opinion.  Dr. Wallace Cullens saw the patient as well and explained standard of practice to the patient.  Patient repeated to me multiple times that she feels extremely uncomfortable with this decision and will defer to her hand surgeon "Dr. Melvyn Novas who has done a ton of surgeries on me in the past."  She states that she has "never seen anything like this."  I did provide reassurance to the patient as best as I could that  her body would heal itself.  I also provided a thumb spica.  Patient was initially reluctant to use this because she states it would hurt her wound."  She was also initially reluctant to use nonstick gauze stating that "the plastic was coming apart."  I tried to use regular gauze but this was also unsatisfactory to the patient who eventually relented to the nonstick gauze.  Ultimately the patient does  not appear to have any evidence of active bleeding at this time.  It does appear to be a superficial wound and there does not appear to be any types of infection.  I did have a long discussion with the patient about wound dehiscence and I also offered to provide medical literature providing the standard of care for wound repair and why we do not do it due to risks of infection.  Patient declined any of this.  At this point I think we have done what we are able to to provide reassurance to the patient who has a well-appearing wound.  I have provided the patient with patient handouts about wound dehiscence and nonsutured wound care.  Clean and personally dressed the wound for the patient and apply a Velcro thumb spica splint.  I have advised the patient on regular wound care which includes gentle cleansing with antibacterial soap and water twice a day and careful monitoring to look for signs of infection.  I went over the signs of infection and listed them directly in her AVS.  She may follow closely with her hand specialist with whom she seems to feel most comfortable.        Final Clinical Impression(s) / ED Diagnoses Final diagnoses:  Visit for wound check  Wound dehiscence    Rx / DC Orders ED Discharge Orders     None         Arthor Captain, PA-C 09/12/23 1202    Tanda Rockers A, DO 09/16/23 [phone]

## 2023-09-12 NOTE — ED Triage Notes (Signed)
The patient stated she had sutures done here last night on her right hand. She stated she is having bleeding from the area.

## 2023-09-12 NOTE — Discharge Instructions (Signed)
Return to the emergency room for the following reasons: You have any of these signs of infection: More redness, swelling, or pain around your wound. Your wound is suddenly hot, red or swollen. Pus or a bad smell coming from your wound. A fever. You have blood Pouring out of your wound will not stop after holding direct, firm pressure on the wound for 20 minutes without stopping.

## 2023-09-20 HISTORY — PX: HAND SURGERY: SHX662

## 2024-01-04 ENCOUNTER — Emergency Department (HOSPITAL_BASED_OUTPATIENT_CLINIC_OR_DEPARTMENT_OTHER): Payer: Medicaid Other

## 2024-01-04 ENCOUNTER — Encounter (HOSPITAL_BASED_OUTPATIENT_CLINIC_OR_DEPARTMENT_OTHER): Payer: Self-pay | Admitting: Emergency Medicine

## 2024-01-04 ENCOUNTER — Other Ambulatory Visit: Payer: Self-pay

## 2024-01-04 ENCOUNTER — Emergency Department (HOSPITAL_BASED_OUTPATIENT_CLINIC_OR_DEPARTMENT_OTHER)
Admission: EM | Admit: 2024-01-04 | Discharge: 2024-01-04 | Disposition: A | Discharge status: Home / Self Care | Payer: Medicaid Other | Attending: Emergency Medicine | Admitting: Emergency Medicine

## 2024-01-04 ENCOUNTER — Other Ambulatory Visit (HOSPITAL_BASED_OUTPATIENT_CLINIC_OR_DEPARTMENT_OTHER): Payer: Self-pay

## 2024-01-04 DIAGNOSIS — Z91040 Latex allergy status: Secondary | ICD-10-CM | POA: Diagnosis not present

## 2024-01-04 DIAGNOSIS — Z20822 Contact with and (suspected) exposure to covid-19: Secondary | ICD-10-CM | POA: Insufficient documentation

## 2024-01-04 DIAGNOSIS — M546 Pain in thoracic spine: Secondary | ICD-10-CM | POA: Insufficient documentation

## 2024-01-04 DIAGNOSIS — R112 Nausea with vomiting, unspecified: Secondary | ICD-10-CM | POA: Diagnosis not present

## 2024-01-04 LAB — CBC WITH DIFFERENTIAL/PLATELET
Abs Immature Granulocytes: 0.02 10*3/uL (ref 0.00–0.07)
Basophils Absolute: 0 10*3/uL (ref 0.0–0.1)
Basophils Relative: 0 %
Eosinophils Absolute: 0.3 10*3/uL (ref 0.0–0.5)
Eosinophils Relative: 3 %
HCT: 38 % (ref 36.0–46.0)
Hemoglobin: 13.3 g/dL (ref 12.0–15.0)
Immature Granulocytes: 0 %
Lymphocytes Relative: 35 %
Lymphs Abs: 3.2 10*3/uL (ref 0.7–4.0)
MCH: 31.4 pg (ref 26.0–34.0)
MCHC: 35 g/dL (ref 30.0–36.0)
MCV: 89.6 fL (ref 80.0–100.0)
Monocytes Absolute: 0.6 10*3/uL (ref 0.1–1.0)
Monocytes Relative: 7 %
Neutro Abs: 4.9 10*3/uL (ref 1.7–7.7)
Neutrophils Relative %: 55 %
Platelets: 202 10*3/uL (ref 150–400)
RBC: 4.24 MIL/uL (ref 3.87–5.11)
RDW: 13.2 % (ref 11.5–15.5)
WBC: 9.1 10*3/uL (ref 4.0–10.5)
nRBC: 0 % (ref 0.0–0.2)

## 2024-01-04 LAB — URINALYSIS, ROUTINE W REFLEX MICROSCOPIC
Bilirubin Urine: NEGATIVE
Glucose, UA: NEGATIVE mg/dL
Ketones, ur: NEGATIVE mg/dL
Leukocytes,Ua: NEGATIVE
Nitrite: NEGATIVE
Protein, ur: NEGATIVE mg/dL
Specific Gravity, Urine: 1.01 (ref 1.005–1.030)
pH: 6 (ref 5.0–8.0)

## 2024-01-04 LAB — RESP PANEL BY RT-PCR (RSV, FLU A&B, COVID)  RVPGX2
Influenza A by PCR: NEGATIVE
Influenza B by PCR: NEGATIVE
Resp Syncytial Virus by PCR: NEGATIVE
SARS Coronavirus 2 by RT PCR: NEGATIVE

## 2024-01-04 LAB — BASIC METABOLIC PANEL
Anion gap: 10 (ref 5–15)
BUN: 11 mg/dL (ref 6–20)
CO2: 20 mmol/L — ABNORMAL LOW (ref 22–32)
Calcium: 9.1 mg/dL (ref 8.9–10.3)
Chloride: 106 mmol/L (ref 98–111)
Creatinine, Ser: 0.7 mg/dL (ref 0.44–1.00)
GFR, Estimated: >60 mL/min (ref >60)
Glucose, Bld: 90 mg/dL (ref 70–99)
Potassium: 4.2 mmol/L (ref 3.5–5.1)
Sodium: 136 mmol/L (ref 135–145)

## 2024-01-04 LAB — URINALYSIS, MICROSCOPIC (REFLEX)

## 2024-01-04 LAB — TROPONIN I (HIGH SENSITIVITY): Troponin I (High Sensitivity): 2 ng/L (ref <18)

## 2024-01-04 LAB — D-DIMER, QUANTITATIVE: D-Dimer, Quant: 0.69 ug{FEU}/mL — ABNORMAL HIGH (ref 0.00–0.50)

## 2024-01-04 LAB — PREGNANCY, URINE: Preg Test, Ur: NEGATIVE

## 2024-01-04 MED ORDER — LIDOCAINE 5 % EX PTCH
1.0000 | MEDICATED_PATCH | CUTANEOUS | Status: DC
  Administered 2024-01-04: 1 via TRANSDERMAL
  Filled 2024-01-04: qty 1

## 2024-01-04 MED ORDER — FENTANYL CITRATE PF 50 MCG/ML IJ SOSY
50.0000 ug | PREFILLED_SYRINGE | Freq: Once | INTRAMUSCULAR | Status: AC
  Administered 2024-01-04: 50 ug via INTRAVENOUS
  Filled 2024-01-04: qty 1

## 2024-01-04 MED ORDER — LIDOCAINE 5 % EX PTCH
1.0000 | MEDICATED_PATCH | CUTANEOUS | 0 refills | Status: DC
  Filled 2024-01-04: qty 30, 30d supply, fill #0

## 2024-01-04 MED ORDER — IOHEXOL 350 MG/ML SOLN
100.0000 mL | Freq: Once | INTRAVENOUS | Status: AC | PRN
  Administered 2024-01-04: 100 mL via INTRAVENOUS

## 2024-01-04 MED ORDER — CYCLOBENZAPRINE HCL 10 MG PO TABS
10.0000 mg | ORAL_TABLET | Freq: Two times a day (BID) | ORAL | 0 refills | Status: DC | PRN
  Filled 2024-01-04: qty 20, 10d supply, fill #0

## 2024-01-04 NOTE — Discharge Instructions (Signed)
 Use lidocaine  patches, Tylenol , ibuprofen .  He can buy lidocaine  at his over-the-counter if your insurance does not cover prescription patches.  Have also prescribed you a muscle relaxant called Flexeril .  This medication is sedating.  Please be careful with its use.  Do not mix with alcohol drugs or dangerous activities including driving.

## 2024-01-04 NOTE — ED Triage Notes (Addendum)
 Patient presents with left upper abd pain x few days. Also reports hemoptysis and nausea/vomiting since this am. States "I feel dehydrated"  Patient also endorses left sided chest pain that worsens with movement

## 2024-01-04 NOTE — ED Notes (Signed)
 Pt alert and oriented X 4 at the time of discharge. RR even and unlabored. No acute distress noted. Pt verbalized understanding of discharge instructions as discussed. Pt ambulatory to lobby at time of discharge.

## 2024-01-04 NOTE — ED Provider Notes (Signed)
 [place] [facility] Provider Note   CSN: [id number] Arrival date & time: 01/04/24  9480     History  Chief Complaint  Patient presents with   Abdominal Pain   Emesis   Chest Pain    Janice Boyd is a 48 y.o. female.  Patient here with left upper/side pain in the back worse with movement.  Does do some manual labor for work.  Has had some nausea and vomiting.  May be coughed up blood once.  Chest pain worsens with deep breath.  Worse with movement.  She has had some GI symptoms also.  Denies diarrhea.  History of headache and seizures.  Denies any fever chills weakness numbness tingling.  No abdominal pain or pain with urination.  The history is provided by the patient.       Home Medications Prior to Admission medications   Medication Sig Start Date End Date Taking? Authorizing Provider  cyclobenzaprine  (FLEXERIL ) 10 MG tablet Take 1 tablet (10 mg total) by mouth 2 (two) times daily as needed for muscle spasms. 01/04/24  Yes Budd Freiermuth, DO  lidocaine  (LIDODERM ) 5 % Place 1 patch onto the skin daily. Remove & Discard patch within 12 hours or as directed by MD 01/04/24  Yes Albeiro Trompeter, DO  Calcium Carbonate (CALTRATE 600 PO) Take 600 mg by mouth daily.     [provider]  cetirizine (ZYRTEC) 10 MG tablet Take 10 mg by mouth daily. 01/09/21   [provider]  ibuprofen  (ADVIL ) 800 MG tablet Take 1 tablet (800 mg total) by mouth 3 (three) times daily. 01/28/21   O'NealDarryle Ned, MD  Multiple Vitamins-Minerals (MULTIVITAMIN PO) Take 1 tablet by mouth 3 (three) times a week.    [provider]      Allergies    Augmentin [amoxicillin-pot clavulanate], Doxycycline, and Latex    Review of Systems   Review of Systems  Physical Exam Updated Vital Signs BP 125/86 (BP Location: Left Arm)   Pulse 62   Temp 98 F (36.7 C) (Oral)   Resp 18   Ht 5' 7 (1.702 m)   Wt 95.3 kg   SpO2 100%   BMI 32.89 kg/m   Physical Exam Vitals and nursing note reviewed.  Constitutional:      General: She is not in acute distress.    Appearance: She is well-developed. She is not ill-appearing.  HENT:     Head: Normocephalic and atraumatic.     Mouth/Throat:     Mouth: Mucous membranes are moist.  Eyes:     Extraocular Movements: Extraocular movements intact.     Conjunctiva/sclera: Conjunctivae normal.     Pupils: Pupils are equal, round, and reactive to light.  Cardiovascular:     Rate and Rhythm: Normal rate and regular rhythm.     Heart sounds: Normal heart sounds. No murmur heard. Pulmonary:     Effort: Pulmonary effort is normal. No respiratory distress.     Breath sounds: Normal breath sounds.  Abdominal:     General: Abdomen is flat.     Palpations: Abdomen is soft.     Tenderness: There is no abdominal tenderness.  Musculoskeletal:        General: No swelling.     Cervical back: Neck supple.     Comments: Reproducible tenderness to the posterior thoracic upper and lower paraspinal muscles but no midline spinal tenderness, no CVA tenderness  Skin:    General: Skin is warm and  dry.     Capillary Refill: Capillary refill takes less than 2 seconds.  Neurological:     Mental Status: She is alert.  Psychiatric:        Mood and Affect: Mood normal.     ED Results / Procedures / Treatments   Labs (all labs ordered are listed, but only abnormal results are displayed) Labs Reviewed  BASIC METABOLIC PANEL - Abnormal; Notable for the following components:      Result Value   CO2 20 (*)    All other components within normal limits  D-DIMER, QUANTITATIVE - Abnormal; Notable for the following components:   D-Dimer, Quant 0.69 (*)    All other components within normal limits  URINALYSIS, ROUTINE W REFLEX MICROSCOPIC - Abnormal; Notable for the following components:   Hgb urine dipstick LARGE (*)    All other components within normal limits  URINALYSIS, MICROSCOPIC (REFLEX) - Abnormal;  Notable for the following components:   Bacteria, UA RARE (*)    All other components within normal limits  RESP PANEL BY RT-PCR (RSV, FLU A&B, COVID)  [id number]  CBC WITH DIFFERENTIAL/PLATELET  PREGNANCY, URINE  TROPONIN I (HIGH SENSITIVITY)    EKG EKG Interpretation Date/Time:  Tuesday January 04 2024 05:45:06 EST Ventricular Rate:  57 PR Interval:  164 QRS Duration:  80 QT Interval:  413 QTC Calculation: 403 R Axis:   76  Text Interpretation: Sinus rhythm Confirmed by Nettie, April ([id number]) on 01/04/2024 5:53:36 AM  Radiology CT ABDOMEN PELVIS W CONTRAST Result Date: 01/04/2024 CLINICAL DATA:  Abdominal pain, acute, nonlocalized Left upper quadrant abdominal pain for a few days with hemoptysis, nausea and vomiting this morning. EXAM: CT ABDOMEN AND PELVIS WITH CONTRAST TECHNIQUE: Multidetector CT imaging of the abdomen and pelvis was performed using the standard protocol following bolus administration of intravenous contrast. RADIATION DOSE REDUCTION: This exam was performed according to the departmental dose-optimization program which includes automated exposure control, adjustment of the mA and/or kV according to patient size and/or use of iterative reconstruction technique. CONTRAST:  OMNIPAQUE  IOHEXOL  350 MG/ML SOLN COMPARISON:  Abdominopelvic CT 06/05/2022 FINDINGS: Lower chest:  Chest findings dictated separately. Hepatobiliary: The liver is normal in density without suspicious focal abnormality. No evidence of gallstones, gallbladder wall thickening or biliary dilatation. Pancreas: Unremarkable. No pancreatic ductal dilatation or surrounding inflammatory changes. Spleen: Normal in size without focal abnormality. Adrenals/Urinary Tract: Both adrenal glands appear normal. No evidence of urinary tract calculus, suspicious renal lesion or hydronephrosis. The bladder appears unremarkable for its degree of distention. Stomach/Bowel: No enteric contrast administered. The stomach appears  unremarkable for its degree of distension. No evidence of bowel wall thickening, distention or surrounding inflammatory change. The appendix appears normal. Vascular/Lymphatic: Mild aortic atherosclerosis without evidence of aneurysm or large vessel occlusion. No enlarged abdominopelvic lymph nodes identified. Reproductive: The uterus and ovaries appear unremarkable. No adnexal mass. Other: No evidence of abdominal wall mass or hernia. No ascites or pneumoperitoneum. Musculoskeletal: No acute or significant osseous findings. Stable irregularity of the symphysis pubis and subchondral cysts in both acetabuli. IMPRESSION: 1. No acute findings or explanation for the patient's symptoms. 2. Chest findings dictated separately. 3.  Aortic Atherosclerosis (ICD10-I70.0). Electronically Signed   By: Elsie Perone M.D.   On: 01/04/2024 11:12   CT Angio Chest PE W and/or Wo Contrast Result Date: 01/04/2024 CLINICAL DATA:  Acute left upper quadrant and chest pain with hemoptysis, nausea and vomiting EXAM: CT ANGIOGRAPHY CHEST WITH CONTRAST TECHNIQUE: Multidetector CT imaging of the chest  was performed using the standard protocol during bolus administration of intravenous contrast. Multiplanar CT image reconstructions and MIPs were obtained to evaluate the vascular anatomy. RADIATION DOSE REDUCTION: This exam was performed according to the departmental dose-optimization program which includes automated exposure control, adjustment of the mA and/or kV according to patient size and/or use of iterative reconstruction technique. CONTRAST:  OMNIPAQUE  IOHEXOL  350 MG/ML SOLN COMPARISON:  06/05/2022 FINDINGS: Cardiovascular: Satisfactory opacification of the pulmonary arteries to the segmental level. No evidence of pulmonary embolism. Normal heart size. No pericardial effusion. Mediastinum/Nodes: No enlarged mediastinal, hilar, or axillary lymph nodes. Thyroid gland, trachea, and esophagus demonstrate no significant findings.  Lungs/Pleura: Lungs are clear. No pleural effusion or pneumothorax. Upper Abdomen: No acute abnormality. Musculoskeletal: No chest wall abnormality. No acute or significant osseous findings. Review of the MIP images confirms the above findings. IMPRESSION: Negative for acute pulmonary embolus or other acute intrathoracic finding. Electronically Signed   By: CHRISTELLA.  Shick M.D.   On: 01/04/2024 11:10   DG Chest Portable 1 View Result Date: 01/04/2024 CLINICAL DATA:  Cough.  Left upper abdominal pain for a few days EXAM: PORTABLE CHEST 1 VIEW COMPARISON:  01/14/2021 FINDINGS: Upper normal heart size. There is no edema, consolidation, effusion, or pneumothorax. Normal aortic and hilar contours. No osseous findings. IMPRESSION: No evidence of active disease. Electronically Signed   By: Dorn Roulette M.D.   On: 01/04/2024 08:24    Procedures Procedures    Medications Ordered in ED Medications  lidocaine  (LIDODERM ) 5 % 1 patch (1 patch Transdermal Patch Applied 01/04/24 0934)  fentaNYL  (SUBLIMAZE ) injection 50 mcg (50 mcg Intravenous Given 01/04/24 1011)  iohexol  (OMNIPAQUE ) 350 MG/ML injection 100 mL (100 mLs Intravenous Contrast Given 01/04/24 1034)    ED Course/ Medical Decision Making/ A&P                                 Medical Decision Making Amount and/or Complexity of Data Reviewed Labs: ordered. Radiology: ordered.  Risk Prescription drug management.   Janice Boyd is here with left back/left chest pain, she has had some nausea and vomiting as well.  Maybe some blood in her vomit.  Not having any abdominal pain.  No pain at urination.  No major medical problems.  She does work youth worker job.  Her pain is reproducible on exam.  Worse with movement.  Sounds like she has a musculoskeletal process that may be GI related process as well.  Seems viral.  She does not have any abdominal tenderness.  No pain at urination.  I do not think that this is a bowel obstruction or appendicitis or  other acute intra-abdominal process.  EKG shows sinus rhythm.  No ischemic changes.  I reviewed interpreted EKG.  Overall we will get CBC BMP troponin D-dimer chest x-ray COVID and flu test.  Will give lidocaine  patch.  Will check urinalysis.  I have no concern for cholecystitis or pancreatitis at this time.  Does not seem consistent with kidney stone either.  Overall we will reevaluate.  CT scan of the chest abdomen and pelvis unremarkable.  No blood clot.  No kidney stone or other acute process.  No urinary tract infection.  Troponin normal.  Overall I have no concern for ACS PE or other acute cardiac pulmonary process.  I think that this is muscular.  Recommend Tylenol  ibuprofen  and Flexeril  and lidocaine  patches.  Discharged in good condition.  This  chart was dictated using voice recognition software.  Despite best efforts to proofread,  errors can occur which can change the documentation meaning.         Final Clinical Impression(s) / ED Diagnoses Final diagnoses:  Acute left-sided thoracic back pain    Rx / DC Orders ED Discharge Orders          Ordered    cyclobenzaprine  (FLEXERIL ) 10 MG tablet  2 times daily PRN        01/04/24 1138    lidocaine  (LIDODERM ) 5 %  Every 24 hours        01/04/24 1138              Sheala Dosh, DO 01/04/24 1139

## 2024-01-14 ENCOUNTER — Encounter: Payer: Self-pay | Admitting: Gastroenterology

## 2024-02-07 ENCOUNTER — Ambulatory Visit (AMBULATORY_SURGERY_CENTER): Payer: Medicaid Other

## 2024-02-07 VITALS — Ht 67.0 in | Wt 215.0 lb

## 2024-02-07 DIAGNOSIS — Z1211 Encounter for screening for malignant neoplasm of colon: Secondary | ICD-10-CM

## 2024-02-07 MED ORDER — SUFLAVE 178.7 G PO SOLR: 1.0000 | Freq: Once | ORAL | 0 refills | Status: AC

## 2024-02-07 NOTE — Progress Notes (Signed)
 No egg or soy allergy known to patient  No issues known to pt with past sedation with any surgeries or procedures Patient denies ever being told they had issues or difficulty with intubation  No FH of Malignant Hyperthermia Pt is not on diet pills Pt is not on home 02  Pt is not on blood thinners  No GLP medications Pt denies issues with constipation  No A fib or A flutter Have any cardiac testing pending--no Ambulates independently Patient's chart reviewed by Cathlyn Parsons CNRA prior to previsit and patient appropriate for the [facility].  Previsit completed and red dot placed by patient's name on their procedure day (on provider's schedule).

## 2024-02-09 ENCOUNTER — Telehealth: Payer: Self-pay | Admitting: Physical Therapy

## 2024-02-09 ENCOUNTER — Ambulatory Visit: Payer: Medicaid Other | Admitting: Physical Therapy

## 2024-02-09 ENCOUNTER — Ambulatory Visit: Payer: Self-pay | Attending: Obstetrics and Gynecology | Admitting: Physical Therapy

## 2024-02-09 DIAGNOSIS — R252 Cramp and spasm: Secondary | ICD-10-CM | POA: Insufficient documentation

## 2024-02-09 DIAGNOSIS — R102 Pelvic and perineal pain: Secondary | ICD-10-CM | POA: Insufficient documentation
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
## 2024-02-09 NOTE — Telephone Encounter (Signed)
 Called patient about her missed eval today at 8:45. Left a message.  Eulis Foster, PT @2 /19/25@ 9:08 AM
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
## 2024-02-16 ENCOUNTER — Ambulatory Visit: Payer: Medicaid Other | Admitting: Physical Therapy

## 2024-02-16 ENCOUNTER — Other Ambulatory Visit: Payer: Self-pay

## 2024-02-16 ENCOUNTER — Encounter: Payer: Self-pay | Admitting: Physical Therapy

## 2024-02-16 DIAGNOSIS — R252 Cramp and spasm: Secondary | ICD-10-CM | POA: Diagnosis present

## 2024-02-16 DIAGNOSIS — R102 Pelvic and perineal pain: Secondary | ICD-10-CM

## 2024-02-16 NOTE — Patient Instructions (Signed)
 Have partner place finger in the  vaginal to massage the sides of the pelvic floor muscles. Swipe 10 times each side. Every other day.  Eulis Foster, PT @2 /26/27@ 9:25 AM [facility] [address], [address] [place], [place] [address] Phone # [phone] Fax [phone]

## 2024-02-23 ENCOUNTER — Ambulatory Visit: Payer: Medicaid Other | Admitting: Physical Therapy

## 2024-03-01 ENCOUNTER — Telehealth: Payer: Self-pay | Admitting: Physical Therapy

## 2024-03-01 ENCOUNTER — Ambulatory Visit: Payer: Medicaid Other | Attending: Obstetrics and Gynecology | Admitting: Physical Therapy

## 2024-03-01 DIAGNOSIS — R102 Pelvic and perineal pain: Secondary | ICD-10-CM | POA: Insufficient documentation

## 2024-03-01 DIAGNOSIS — R252 Cramp and spasm: Secondary | ICD-10-CM | POA: Insufficient documentation

## 2024-03-01 NOTE — Telephone Encounter (Signed)
 Called patient about her missed appointment today at 8:45.  Eulis Foster, PT @3 /12/25@ 9:04 AM

## 2024-03-06 ENCOUNTER — Encounter: Payer: Self-pay | Admitting: Gastroenterology

## 2024-03-06 ENCOUNTER — Ambulatory Visit (AMBULATORY_SURGERY_CENTER): Payer: Medicaid Other | Admitting: Gastroenterology

## 2024-03-06 VITALS — BP 104/73 | HR 62 | Temp 97.9°F | Resp 20 | Ht 67.0 in | Wt 215.0 lb

## 2024-03-06 DIAGNOSIS — K5730 Diverticulosis of large intestine without perforation or abscess without bleeding: Secondary | ICD-10-CM

## 2024-03-06 DIAGNOSIS — Z1211 Encounter for screening for malignant neoplasm of colon: Secondary | ICD-10-CM

## 2024-03-06 MED ORDER — SODIUM CHLORIDE 0.9 % IV SOLN
500.0000 mL | Freq: Once | INTRAVENOUS | Status: DC
Start: 2024-03-06 — End: 2024-03-06

## 2024-03-06 NOTE — Op Note (Signed)
 [facility] Patient Name: Janice Boyd Procedure Date: 03/06/2024 11:07 AM MRN: [id number] Endoscopist: Viviann Spare P. Adela Lank , MD, [id number] Age: 48 Referring MD:  Date of Birth: 1976-02-11 Gender: Female Account #: [id number] Procedure:                Colonoscopy Indications:              Screening for colorectal malignant neoplasm, This                            is the patient's first colonoscopy - mother had                            polyps and colonic resection, she is not aware of                            any colon cancer history in the family Medicines:                Monitored Anesthesia Care Procedure:                Pre-Anesthesia Assessment:                           - Prior to the procedure, a History and Physical                            was performed, and patient medications and                            allergies were reviewed. The patient's tolerance of                            previous anesthesia was also reviewed. The risks                            and benefits of the procedure and the sedation                            options and risks were discussed with the patient.                            All questions were answered, and informed consent                            was obtained. Prior Anticoagulants: The patient has                            taken no anticoagulant or antiplatelet agents. ASA                            Grade Assessment: II - A patient with mild systemic                            disease. After reviewing the risks and benefits,  the patient was deemed in satisfactory condition to                            undergo the procedure.                           After obtaining informed consent, the colonoscope                            was passed under direct vision. Throughout the                            procedure, the patient's blood pressure, pulse, and                            oxygen  saturations were monitored continuously. The                            CF [id number] #[id number] was introduced through the anus                            and advanced to the the cecum, identified by                            appendiceal orifice and ileocecal valve. The                            colonoscopy was performed without difficulty. The                            patient tolerated the procedure well. The quality                            of the bowel preparation was adequate. The                            ileocecal valve, appendiceal orifice, and rectum                            were photographed. Scope In: 11:18:27 AM Scope Out: 11:35:04 AM Scope Withdrawal Time: 0 hours 12 minutes 54 seconds  Total Procedure Duration: 0 hours 16 minutes 37 seconds  Findings:                 The perianal and digital rectal examinations were                            normal.                           Multiple diverticula were found in the entire                            colon, highest burden in the left colon  The exam was otherwise without abnormality. Complications:            No immediate complications. Estimated blood loss:                            None. Estimated Blood Loss:     Estimated blood loss: none. Impression:               - Diverticulosis in the entire examined colon.                           - The examination was otherwise normal.                           - No polyps. Recommendation:           - Patient has a contact number available for                            emergencies. The signs and symptoms of potential                            delayed complications were discussed with the                            patient. Return to normal activities tomorrow.                            Written discharge instructions were provided to the                            patient.                           - Resume previous diet.                           -  Continue present medications.                           - Repeat colonoscopy in 10 years for screening                            purposes. Discussed with patient she should discuss                            with her mother what surgery she had regarding                            colonic resection, ensure no advanced polyp or                            colon cancer, which could influence colonoscopy                            surveillance intervals for the patient Willaim Rayas. Kinsie Belford, MD 03/06/2024 11:39:58 AM This report has  been signed electronically.
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
## 2024-03-06 NOTE — Patient Instructions (Signed)

## 2024-03-06 NOTE — Progress Notes (Signed)
 Pt's states no medical or surgical changes since previsit or office visit.

## 2024-03-06 NOTE — Progress Notes (Signed)
 Vss nad trans to pacu

## 2024-03-07 ENCOUNTER — Telehealth: Payer: Self-pay

## 2024-03-07 NOTE — Telephone Encounter (Signed)
  Follow up Call-     03/06/2024   10:43 AM  Call back number  Post procedure Call Back phone  # [phone]  Permission to leave phone message Yes     Patient questions:  Do you have a fever, pain , or abdominal swelling? No. Pain Score  0 *  Have you tolerated food without any problems? Yes.    Have you been able to return to your normal activities? Yes.    Do you have any questions about your discharge instructions: Diet   No. Medications  No. Follow up visit  No.  Do you have questions or concerns about your Care? No.  Actions: * If pain score is 4 or above: No action needed, pain <4.

## 2024-04-05 ENCOUNTER — Other Ambulatory Visit: Payer: Self-pay

## 2024-04-05 ENCOUNTER — Encounter (HOSPITAL_COMMUNITY): Payer: Self-pay | Admitting: Emergency Medicine

## 2024-04-05 ENCOUNTER — Emergency Department (HOSPITAL_COMMUNITY)
Admission: EM | Admit: 2024-04-05 | Discharge: 2024-04-05 | Discharge status: Against Medical Advice | Attending: Emergency Medicine | Admitting: Emergency Medicine

## 2024-04-05 DIAGNOSIS — Z5321 Procedure and treatment not carried out due to patient leaving prior to being seen by health care provider: Secondary | ICD-10-CM | POA: Insufficient documentation

## 2024-04-05 DIAGNOSIS — R2242 Localized swelling, mass and lump, left lower limb: Secondary | ICD-10-CM | POA: Insufficient documentation

## 2024-04-05 DIAGNOSIS — M79605 Pain in left leg: Secondary | ICD-10-CM | POA: Insufficient documentation

## 2024-04-05 DIAGNOSIS — R309 Painful micturition, unspecified: Secondary | ICD-10-CM | POA: Insufficient documentation

## 2024-04-05 LAB — URINALYSIS, ROUTINE W REFLEX MICROSCOPIC
Bilirubin Urine: NEGATIVE
Glucose, UA: NEGATIVE mg/dL
Hgb urine dipstick: NEGATIVE
Ketones, ur: NEGATIVE mg/dL
Leukocytes,Ua: NEGATIVE
Nitrite: NEGATIVE
Protein, ur: NEGATIVE mg/dL
Specific Gravity, Urine: 1.004 — ABNORMAL LOW (ref 1.005–1.030)
pH: 6 (ref 5.0–8.0)

## 2024-04-05 LAB — COMPREHENSIVE METABOLIC PANEL WITH GFR
ALT: 16 U/L (ref 0–44)
AST: 23 U/L (ref 15–41)
Albumin: 4.3 g/dL (ref 3.5–5.0)
Alkaline Phosphatase: 45 U/L (ref 38–126)
Anion gap: 6 (ref 5–15)
BUN: 8 mg/dL (ref 6–20)
CO2: 25 mmol/L (ref 22–32)
Calcium: 9.2 mg/dL (ref 8.9–10.3)
Chloride: 104 mmol/L (ref 98–111)
Creatinine, Ser: 0.8 mg/dL (ref 0.44–1.00)
GFR, Estimated: >60 mL/min (ref >60)
Glucose, Bld: 89 mg/dL (ref 70–99)
Potassium: 4 mmol/L (ref 3.5–5.1)
Sodium: 135 mmol/L (ref 135–145)
Total Bilirubin: 0.5 mg/dL (ref 0.0–1.2)
Total Protein: 7.5 g/dL (ref 6.5–8.1)

## 2024-04-05 LAB — CBC
HCT: 37.2 % (ref 36.0–46.0)
Hemoglobin: 12.9 g/dL (ref 12.0–15.0)
MCH: 32 pg (ref 26.0–34.0)
MCHC: 34.7 g/dL (ref 30.0–36.0)
MCV: 92.3 fL (ref 80.0–100.0)
Platelets: 234 K/uL (ref 150–400)
RBC: 4.03 MIL/uL (ref 3.87–5.11)
RDW: 13.5 % (ref 11.5–15.5)
WBC: 8.1 K/uL (ref 4.0–10.5)
nRBC: 0 % (ref 0.0–0.2)

## 2024-04-05 LAB — PREGNANCY, URINE: Preg Test, Ur: NEGATIVE

## 2024-04-05 NOTE — ED Triage Notes (Signed)
 Patient coming to ED for evaluation of L groin swelling/pain and L leg swelling.  Reports earlier today she noticed pain in L leg.  Went to work and pain became more severe and radiated down leg and into back and abdomen.  Having pain when urinating.  No reports of injury.  Pt tearful due to pain. Has tried multiple OTC medications, stretching, and home remedies without relief.

## 2024-04-05 NOTE — ED Notes (Signed)
 Pt refused bed , stated family wanted her to leave and was seen leaving ED

## 2024-04-18 ENCOUNTER — Telehealth: Payer: Self-pay | Admitting: Physical Therapy

## 2024-04-18 NOTE — Telephone Encounter (Signed)
 Spoke to patient on the phone. She has some medical issues she is working on . She would like to be discharged at this time to take care of the medical issues.  Marsha Skeen, PT @4 /29/25@ 9:46 AM;

## 2024-04-19 ENCOUNTER — Ambulatory Visit: Payer: Medicaid Other | Admitting: Physical Therapy

## 2024-04-26 ENCOUNTER — Encounter: Payer: Medicaid Other | Admitting: Physical Therapy

## 2024-05-03 ENCOUNTER — Encounter: Payer: Medicaid Other | Admitting: Physical Therapy

## 2024-05-10 ENCOUNTER — Encounter: Payer: Medicaid Other | Admitting: Physical Therapy

## 2024-05-17 ENCOUNTER — Encounter: Payer: Medicaid Other | Admitting: Physical Therapy

## 2024-07-24 ENCOUNTER — Other Ambulatory Visit: Payer: Self-pay

## 2024-07-24 ENCOUNTER — Emergency Department (HOSPITAL_BASED_OUTPATIENT_CLINIC_OR_DEPARTMENT_OTHER)
Admission: EM | Admit: 2024-07-24 | Discharge: 2024-07-24 | Disposition: A | Discharge status: Home / Self Care | Attending: Emergency Medicine | Admitting: Emergency Medicine

## 2024-07-24 ENCOUNTER — Encounter (HOSPITAL_BASED_OUTPATIENT_CLINIC_OR_DEPARTMENT_OTHER): Payer: Self-pay | Admitting: Emergency Medicine

## 2024-07-24 ENCOUNTER — Emergency Department (HOSPITAL_BASED_OUTPATIENT_CLINIC_OR_DEPARTMENT_OTHER)

## 2024-07-24 DIAGNOSIS — Z91040 Latex allergy status: Secondary | ICD-10-CM | POA: Diagnosis not present

## 2024-07-24 DIAGNOSIS — M545 Low back pain, unspecified: Secondary | ICD-10-CM | POA: Diagnosis present

## 2024-07-24 LAB — COMPREHENSIVE METABOLIC PANEL WITH GFR
ALT: 14 U/L (ref 0–44)
AST: 25 U/L (ref 15–41)
Albumin: 4.5 g/dL (ref 3.5–5.0)
Alkaline Phosphatase: 51 U/L (ref 38–126)
Anion gap: 9 (ref 5–15)
BUN: 8 mg/dL (ref 6–20)
CO2: 26 mmol/L (ref 22–32)
Calcium: 9.3 mg/dL (ref 8.9–10.3)
Chloride: 105 mmol/L (ref 98–111)
Creatinine, Ser: 0.78 mg/dL (ref 0.44–1.00)
GFR, Estimated: >60 mL/min (ref >60)
Glucose, Bld: 89 mg/dL (ref 70–99)
Potassium: 4.2 mmol/L (ref 3.5–5.1)
Sodium: 140 mmol/L (ref 135–145)
Total Bilirubin: 0.2 mg/dL (ref 0.0–1.2)
Total Protein: 7.2 g/dL (ref 6.5–8.1)

## 2024-07-24 LAB — URINALYSIS, ROUTINE W REFLEX MICROSCOPIC
Bilirubin Urine: NEGATIVE
Glucose, UA: NEGATIVE mg/dL
Ketones, ur: NEGATIVE mg/dL
Leukocytes,Ua: NEGATIVE
Nitrite: NEGATIVE
Protein, ur: NEGATIVE mg/dL
Specific Gravity, Urine: <=1.005 (ref 1.005–1.030)
pH: 5.5 (ref 5.0–8.0)

## 2024-07-24 LAB — CBC
HCT: 35.8 % — ABNORMAL LOW (ref 36.0–46.0)
Hemoglobin: 12.5 g/dL (ref 12.0–15.0)
MCH: 31.7 pg (ref 26.0–34.0)
MCHC: 34.9 g/dL (ref 30.0–36.0)
MCV: 90.9 fL (ref 80.0–100.0)
Platelets: 244 K/uL (ref 150–400)
RBC: 3.94 MIL/uL (ref 3.87–5.11)
RDW: 13.2 % (ref 11.5–15.5)
WBC: 7.5 K/uL (ref 4.0–10.5)
nRBC: 0 % (ref 0.0–0.2)

## 2024-07-24 LAB — PREGNANCY, URINE: Preg Test, Ur: NEGATIVE

## 2024-07-24 LAB — URINALYSIS, MICROSCOPIC (REFLEX): WBC, UA: NONE SEEN WBC/hpf (ref 0–5)

## 2024-07-24 LAB — LIPASE, BLOOD: Lipase: 14 U/L (ref 11–51)

## 2024-07-24 MED ORDER — ONDANSETRON 4 MG PO TBDP
4.0000 mg | ORAL_TABLET | Freq: Once | ORAL | Status: AC
  Administered 2024-07-24: 4 mg via ORAL
  Filled 2024-07-24: qty 1

## 2024-07-24 MED ORDER — IBUPROFEN 400 MG PO TABS
400.0000 mg | ORAL_TABLET | Freq: Once | ORAL | Status: AC | PRN
  Administered 2024-07-24: 400 mg via ORAL
  Filled 2024-07-24: qty 1

## 2024-07-24 MED ORDER — CYCLOBENZAPRINE HCL 10 MG PO TABS: 10.0000 mg | ORAL_TABLET | Freq: Two times a day (BID) | ORAL | 0 refills | Status: AC | PRN

## 2024-07-24 MED ORDER — MORPHINE SULFATE (PF) 4 MG/ML IV SOLN
4.0000 mg | Freq: Once | INTRAVENOUS | Status: AC
  Administered 2024-07-24: 4 mg via INTRAVENOUS
  Filled 2024-07-24: qty 1

## 2024-07-24 MED ORDER — NAPROXEN 375 MG PO TABS: 375.0000 mg | ORAL_TABLET | Freq: Two times a day (BID) | ORAL | 0 refills | Status: AC

## 2024-07-24 NOTE — ED Triage Notes (Signed)
 Pt reports back pain, n/v/d, body aches, suprapubic cramping ABD pain that started this morning  Also reports passing clots with her period that started yesterday

## 2024-07-24 NOTE — Discharge Instructions (Addendum)
 Take the medications as prescribed to help with your pain.  Follow-up with your doctor to be rechecked.

## 2024-07-24 NOTE — ED Provider Notes (Signed)
 [place] [facility] Provider Note   CSN: [id number] Arrival date & time: 07/24/24  1701     Patient presents with: Back Pain and Emesis   Janice Boyd is a 48 y.o. female.  {Add pertinent medical, surgical, social history, OB history to HPI:32947}  Back Pain Emesis    Patient has a history of headaches seizures arthritis.  Patient presents ED with complaints of back pain.  Patient states she has been having back stiffness ongoing since this morning.  She feels it mostly on the left side.  Her lower back is tender to touch.  Prior to Admission medications   Medication Sig Start Date End Date Taking? Authorizing Provider  Calcium Carbonate (CALTRATE 600 PO) Take 600 mg by mouth daily.     [provider]  cetirizine (ZYRTEC) 10 MG tablet Take 10 mg by mouth daily as needed. 01/09/21   [provider]  cyclobenzaprine  (FLEXERIL ) 10 MG tablet Take 1 tablet (10 mg total) by mouth 2 (two) times daily as needed for muscle spasms. 01/04/24   Curatolo, Adam, DO  ibuprofen  (ADVIL ) 800 MG tablet Take 1 tablet (800 mg total) by mouth 3 (three) times daily. Patient not taking: Reported on 03/06/2024 01/28/21   Barbaraann Darryle Ned, MD  Multiple Vitamins-Minerals (MULTIVITAMIN PO) Take 1 tablet by mouth 3 (three) times a week.    [provider]  Naproxen  Sodium (ALEVE ) 220 MG CAPS Take 220 mg by mouth as needed (pain).    [provider]    Allergies: Augmentin [amoxicillin-pot clavulanate], Doxycycline, and Latex    Review of Systems  Gastrointestinal:  Positive for vomiting.  Musculoskeletal:  Positive for back pain.    Updated Vital Signs BP (!) 158/104 (BP Location: Right Arm)   Pulse 67   Temp 98.9 F (37.2 C)   Resp 20   Ht 1.702 m (5' 7)   Wt 90.7 kg   LMP 07/23/2024   SpO2 100%   BMI 31.32 kg/m   Physical Exam  (all labs ordered are listed, but only abnormal results are displayed) Labs Reviewed   CBC - Abnormal; Notable for the following components:      Result Value   HCT 35.8 (*)    All other components within normal limits  URINALYSIS, ROUTINE W REFLEX MICROSCOPIC - Abnormal; Notable for the following components:   Hgb urine dipstick SMALL (*)    All other components within normal limits  URINALYSIS, MICROSCOPIC (REFLEX) - Abnormal; Notable for the following components:   Bacteria, UA RARE (*)    All other components within normal limits  COMPREHENSIVE METABOLIC PANEL WITH GFR  LIPASE, BLOOD  PREGNANCY, URINE    EKG: None  Radiology: No results found.  {Document cardiac monitor, telemetry assessment procedure when appropriate:32947} Procedures   Medications Ordered in the ED  morphine  (PF) 4 MG/ML injection 4 mg (has no administration in time range)  ibuprofen  (ADVIL ) tablet 400 mg (400 mg Oral Given 07/24/24 1738)  ondansetron  (ZOFRAN -ODT) disintegrating tablet 4 mg (4 mg Oral Given 07/24/24 1738)      {Click here for ABCD2, HEART and other calculators REFRESH Note before signing:1}                              Medical Decision Making Amount and/or Complexity of Data Reviewed Labs: ordered.  Risk Prescription drug management.   ***  {Document critical care time when appropriate  Document review of labs and clinical decision tools ie CHADS2VASC2, etc  Document your independent review of radiology images and any outside records  Document your discussion with family members, caretakers and with consultants  Document social determinants of health affecting pt's care  Document your decision making why or why not admission, treatments were needed:32947:::1}   Final diagnoses:  None    ED Discharge Orders     None

## 2024-09-14 ENCOUNTER — Other Ambulatory Visit: Payer: Self-pay | Admitting: Family Medicine

## 2024-09-14 DIAGNOSIS — Z0000 Encounter for general adult medical examination without abnormal findings: Secondary | ICD-10-CM

## 2024-09-25 ENCOUNTER — Ambulatory Visit

## 2024-10-13 ENCOUNTER — Ambulatory Visit

## 2024-10-27 ENCOUNTER — Ambulatory Visit
Admission: RE | Admit: 2024-10-27 | Discharge: 2024-10-27 | Disposition: A | Discharge status: Home / Self Care | Source: Ambulatory Visit | Attending: Family Medicine | Admitting: Family Medicine

## 2024-10-27 DIAGNOSIS — Z0000 Encounter for general adult medical examination without abnormal findings: Secondary | ICD-10-CM
